# Patient Record
Sex: Female | Born: 1980 | Hispanic: Yes | Marital: Married | Smoking: Never smoker
Health system: Southern US, Community
[De-identification: ages and names within clinical notes are randomized; demographics above are authoritative.]

## PROBLEM LIST (undated history)

## (undated) DIAGNOSIS — I10 Essential (primary) hypertension: Secondary | ICD-10-CM

## (undated) DIAGNOSIS — E119 Type 2 diabetes mellitus without complications: Secondary | ICD-10-CM

## (undated) HISTORY — PX: HYSTEROSCOPY W/ ENDOMETRIAL ABLATION: SUR665

## (undated) HISTORY — PX: ABDOMINAL HYSTERECTOMY: SHX81

---

## 2011-02-25 ENCOUNTER — Emergency Department (INDEPENDENT_AMBULATORY_CARE_PROVIDER_SITE_OTHER): Payer: Medicaid Other

## 2011-02-25 ENCOUNTER — Emergency Department (HOSPITAL_BASED_OUTPATIENT_CLINIC_OR_DEPARTMENT_OTHER)
Admission: EM | Admit: 2011-02-25 | Discharge: 2011-02-25 | Disposition: A | Discharge status: Home / Self Care | Payer: Medicaid Other | Attending: Emergency Medicine | Admitting: Emergency Medicine

## 2011-02-25 DIAGNOSIS — W261XXA Contact with sword or dagger, initial encounter: Secondary | ICD-10-CM

## 2011-02-25 DIAGNOSIS — W260XXA Contact with knife, initial encounter: Secondary | ICD-10-CM | POA: Insufficient documentation

## 2011-02-25 DIAGNOSIS — M7989 Other specified soft tissue disorders: Secondary | ICD-10-CM

## 2011-02-25 DIAGNOSIS — S61409A Unspecified open wound of unspecified hand, initial encounter: Secondary | ICD-10-CM | POA: Insufficient documentation

## 2012-12-03 ENCOUNTER — Emergency Department (HOSPITAL_BASED_OUTPATIENT_CLINIC_OR_DEPARTMENT_OTHER): Payer: Medicaid Other

## 2012-12-03 ENCOUNTER — Emergency Department (HOSPITAL_BASED_OUTPATIENT_CLINIC_OR_DEPARTMENT_OTHER)
Admission: EM | Admit: 2012-12-03 | Discharge: 2012-12-03 | Disposition: A | Discharge status: Home / Self Care | Payer: Medicaid Other | Attending: Emergency Medicine | Admitting: Emergency Medicine

## 2012-12-03 ENCOUNTER — Encounter (HOSPITAL_BASED_OUTPATIENT_CLINIC_OR_DEPARTMENT_OTHER): Payer: Self-pay | Admitting: Family Medicine

## 2012-12-03 DIAGNOSIS — R509 Fever, unspecified: Secondary | ICD-10-CM | POA: Insufficient documentation

## 2012-12-03 DIAGNOSIS — R071 Chest pain on breathing: Secondary | ICD-10-CM | POA: Insufficient documentation

## 2012-12-03 DIAGNOSIS — B349 Viral infection, unspecified: Secondary | ICD-10-CM

## 2012-12-03 DIAGNOSIS — B9789 Other viral agents as the cause of diseases classified elsewhere: Secondary | ICD-10-CM | POA: Insufficient documentation

## 2012-12-03 DIAGNOSIS — R0602 Shortness of breath: Secondary | ICD-10-CM | POA: Insufficient documentation

## 2012-12-03 MED ORDER — GUAIFENESIN-CODEINE 100-10 MG/5ML PO SYRP: 5.0000 mL | ORAL_SOLUTION | Freq: Three times a day (TID) | ORAL | Status: DC | PRN

## 2012-12-03 MED ORDER — OSELTAMIVIR PHOSPHATE 75 MG PO CAPS: 75.0000 mg | ORAL_CAPSULE | Freq: Two times a day (BID) | ORAL | Status: DC

## 2012-12-03 NOTE — ED Provider Notes (Addendum)
History     CSN: [id number]  Arrival date & time 12/03/12  4098   First MD Initiated Contact with Patient 12/03/12 [phone]      Chief Complaint  Patient presents with  . Cough    (Consider location/radiation/quality/duration/timing/severity/associated sxs/prior treatment) HPI Comments: Patient presents with a two-day history of myalgias with runny nose cough and chest congestion. She's had some postnasal drip. She's had some fevers and chills. The fevers or subjective. She's had some nausea but no vomiting. She does feel a little bit short of breath. She has some pain on the left side of her chest with coughing and movement. She denies any leg pain or swelling. She's here with her daughter who has the same type symptoms.  Patient is a 31 y.o. female presenting with cough.  Cough Associated symptoms include chest pain, chills, rhinorrhea, myalgias and shortness of breath. Pertinent negatives include no headaches.    History reviewed. No pertinent past medical history.  Past Surgical History  Procedure Date  . Abdominal hysterectomy     No family history on file.  History  Substance Use Topics  . Smoking status: Never Smoker   . Smokeless tobacco: Not on file  . Alcohol Use: No    OB History    Grav Para Term Preterm Abortions TAB SAB Ect Mult Living                  Review of Systems  Constitutional: Positive for fever, chills and fatigue. Negative for diaphoresis.  HENT: Positive for congestion, rhinorrhea and postnasal drip. Negative for sneezing.   Eyes: Negative.   Respiratory: Positive for cough and shortness of breath. Negative for chest tightness.   Cardiovascular: Positive for chest pain. Negative for leg swelling.  Gastrointestinal: Positive for nausea. Negative for vomiting, abdominal pain, diarrhea and blood in stool.  Genitourinary: Negative for frequency, hematuria, flank pain and difficulty urinating.  Musculoskeletal: Positive for myalgias. Negative for  back pain and arthralgias.  Skin: Negative for rash.  Neurological: Negative for dizziness, speech difficulty, weakness, numbness and headaches.    Allergies  Review of patient's allergies indicates no known allergies.  Home Medications   Current Outpatient Rx  Name  Route  Sig  Dispense  Refill  . GUAIFENESIN-CODEINE 100-10 MG/5ML PO SYRP   Oral   Take 5 mLs by mouth 3 (three) times daily as needed for cough.   120 mL   0   . OSELTAMIVIR PHOSPHATE 75 MG PO CAPS   Oral   Take 1 capsule (75 mg total) by mouth every 12 (twelve) hours.   10 capsule   0     BP 113/94  Pulse 94  Temp 98 F (36.7 C) (Oral)  Resp 22  Ht 5' (1.524 m)  Wt 205 lb (92.987 kg)  BMI 40.04 kg/m2  SpO2 98%  Physical Exam  Constitutional: She is oriented to person, place, and time. She appears well-developed and well-nourished.  HENT:  Head: Normocephalic and atraumatic.  Right Ear: External ear normal.  Left Ear: External ear normal.  Mouth/Throat: Oropharynx is clear and moist.  Eyes: Pupils are equal, round, and reactive to light.  Neck: Normal range of motion. Neck supple.       No meningeal signs  Cardiovascular: Normal rate, regular rhythm and normal heart sounds.   Pulmonary/Chest: Effort normal and breath sounds normal. No respiratory distress. She has no wheezes. She has no rales. She exhibits tenderness (reproducible tenderness to the left mid chest wall).  Abdominal: Soft. Bowel sounds are normal. There is no tenderness. There is no rebound and no guarding.  Musculoskeletal: Normal range of motion. She exhibits no edema.  Lymphadenopathy:    She has no cervical adenopathy.  Neurological: She is alert and oriented to person, place, and time.  Skin: Skin is warm and dry. No rash noted.  Psychiatric: She has a normal mood and affect.    ED Course  Procedures (including critical care time)  No results found for this or any previous visit. Dg Chest 2 View  12/03/2012  *RADIOLOGY  REPORT*  Clinical Data: Cough, shortness of breath  CHEST - 2 VIEW  Comparison: None.  Findings: Cardiomediastinal silhouette is unremarkable.  No acute infiltrate or pleural effusion.  No pulmonary edema.  Central minimal bronchitic changes.  IMPRESSION: .  No acute infiltrate or pulmonary edema.  Central minimal bronchitic changes.   Original Report Authenticated By: Natasha Mead, M.D.     Date: 12/03/2012  Rate: 96  Rhythm: normal sinus rhythm  QRS Axis: normal  Intervals: normal  ST/T Wave abnormalities: nonspecific ST/T changes  Conduction Disutrbances:none  Narrative Interpretation:   Old EKG Reviewed: none available     1. Viral syndrome       MDM  Patient with flulike symptoms that started within the last 48 hours. She's well appearing here with normal oxygen saturations. Her last pulse was 86 so she did not have a persistent tachycardia. There is no evidence of pneumonia on a chest x-ray in her lungs are clear on exam. She has no other symptoms suggestive of pulmonary embolus. I will go ahead and start her on Tamiflu and Robitussin a.c. Advised to return if her symptoms worsen or are not improving in the next few days        Rolan Bucco, MD 12/03/12 1110  Rolan Bucco, MD 12/03/12 1147

## 2012-12-03 NOTE — ED Notes (Addendum)
Pt c/o cough and body aches x 4 days, pt also reports chest "tightness" since Monday. Pt took otc cough med without relief. Denies n/v/d and denies shob.

## 2016-10-09 ENCOUNTER — Emergency Department (HOSPITAL_BASED_OUTPATIENT_CLINIC_OR_DEPARTMENT_OTHER)
Admission: EM | Admit: 2016-10-09 | Discharge: 2016-10-09 | Disposition: A | Discharge status: Home / Self Care | Payer: Medicaid Other | Attending: Emergency Medicine | Admitting: Emergency Medicine

## 2016-10-09 ENCOUNTER — Emergency Department (HOSPITAL_BASED_OUTPATIENT_CLINIC_OR_DEPARTMENT_OTHER): Payer: Medicaid Other

## 2016-10-09 ENCOUNTER — Encounter (HOSPITAL_BASED_OUTPATIENT_CLINIC_OR_DEPARTMENT_OTHER): Payer: Self-pay | Admitting: *Deleted

## 2016-10-09 DIAGNOSIS — Y939 Activity, unspecified: Secondary | ICD-10-CM | POA: Insufficient documentation

## 2016-10-09 DIAGNOSIS — S0081XA Abrasion of other part of head, initial encounter: Secondary | ICD-10-CM | POA: Diagnosis not present

## 2016-10-09 DIAGNOSIS — S29011A Strain of muscle and tendon of front wall of thorax, initial encounter: Secondary | ICD-10-CM

## 2016-10-09 DIAGNOSIS — S46812A Strain of other muscles, fascia and tendons at shoulder and upper arm level, left arm, initial encounter: Secondary | ICD-10-CM | POA: Insufficient documentation

## 2016-10-09 DIAGNOSIS — S46912A Strain of unspecified muscle, fascia and tendon at shoulder and upper arm level, left arm, initial encounter: Secondary | ICD-10-CM

## 2016-10-09 DIAGNOSIS — Y929 Unspecified place or not applicable: Secondary | ICD-10-CM | POA: Diagnosis not present

## 2016-10-09 DIAGNOSIS — I10 Essential (primary) hypertension: Secondary | ICD-10-CM | POA: Insufficient documentation

## 2016-10-09 DIAGNOSIS — Y999 Unspecified external cause status: Secondary | ICD-10-CM | POA: Insufficient documentation

## 2016-10-09 DIAGNOSIS — S4992XA Unspecified injury of left shoulder and upper arm, initial encounter: Secondary | ICD-10-CM | POA: Diagnosis present

## 2016-10-09 DIAGNOSIS — S29012A Strain of muscle and tendon of back wall of thorax, initial encounter: Secondary | ICD-10-CM | POA: Insufficient documentation

## 2016-10-09 DIAGNOSIS — Z79899 Other long term (current) drug therapy: Secondary | ICD-10-CM | POA: Diagnosis not present

## 2016-10-09 HISTORY — DX: Essential (primary) hypertension: I10

## 2016-10-09 MED ORDER — IBUPROFEN 800 MG PO TABS
800.0000 mg | ORAL_TABLET | Freq: Once | ORAL | Status: AC
  Administered 2016-10-09: 800 mg via ORAL
  Filled 2016-10-09: qty 1
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
## 2016-10-09 NOTE — ED Notes (Signed)
Pt back from x-ray.

## 2017-09-13 ENCOUNTER — Encounter (HOSPITAL_BASED_OUTPATIENT_CLINIC_OR_DEPARTMENT_OTHER): Payer: Self-pay | Admitting: Adult Health

## 2017-09-13 ENCOUNTER — Emergency Department (HOSPITAL_BASED_OUTPATIENT_CLINIC_OR_DEPARTMENT_OTHER)
Admission: EM | Admit: 2017-09-13 | Discharge: 2017-09-13 | Disposition: A | Discharge status: Home / Self Care | Payer: Medicaid Other | Attending: Emergency Medicine | Admitting: Emergency Medicine

## 2017-09-13 DIAGNOSIS — S3992XA Unspecified injury of lower back, initial encounter: Secondary | ICD-10-CM | POA: Diagnosis present

## 2017-09-13 DIAGNOSIS — Y999 Unspecified external cause status: Secondary | ICD-10-CM | POA: Diagnosis not present

## 2017-09-13 DIAGNOSIS — E119 Type 2 diabetes mellitus without complications: Secondary | ICD-10-CM | POA: Diagnosis not present

## 2017-09-13 DIAGNOSIS — Y92000 Kitchen of unspecified non-institutional (private) residence as  the place of occurrence of the external cause: Secondary | ICD-10-CM | POA: Insufficient documentation

## 2017-09-13 DIAGNOSIS — I10 Essential (primary) hypertension: Secondary | ICD-10-CM | POA: Insufficient documentation

## 2017-09-13 DIAGNOSIS — X500XXA Overexertion from strenuous movement or load, initial encounter: Secondary | ICD-10-CM | POA: Insufficient documentation

## 2017-09-13 DIAGNOSIS — S39012A Strain of muscle, fascia and tendon of lower back, initial encounter: Secondary | ICD-10-CM

## 2017-09-13 DIAGNOSIS — Y9389 Activity, other specified: Secondary | ICD-10-CM | POA: Diagnosis not present

## 2017-09-13 DIAGNOSIS — Z79899 Other long term (current) drug therapy: Secondary | ICD-10-CM | POA: Insufficient documentation

## 2017-09-13 HISTORY — DX: Type 2 diabetes mellitus without complications: E11.9

## 2017-09-13 MED ORDER — DICLOFENAC SODIUM 75 MG PO TBEC: 75.0000 mg | DELAYED_RELEASE_TABLET | Freq: Two times a day (BID) | ORAL | 0 refills | Status: DC

## 2017-09-13 MED ORDER — METHOCARBAMOL 500 MG PO TABS: 500.0000 mg | ORAL_TABLET | Freq: Two times a day (BID) | ORAL | 0 refills | Status: DC

## 2017-09-13 MED ORDER — HYDROMORPHONE HCL 1 MG/ML IJ SOLN
1.0000 mg | Freq: Once | INTRAMUSCULAR | Status: AC
  Administered 2017-09-13: 1 mg via INTRAMUSCULAR
  Filled 2017-09-13: qty 1

## 2017-09-13 MED ORDER — KETOROLAC TROMETHAMINE 60 MG/2ML IM SOLN
60.0000 mg | Freq: Once | INTRAMUSCULAR | Status: AC
  Administered 2017-09-13: 60 mg via INTRAMUSCULAR
  Filled 2017-09-13: qty 2

## 2017-09-13 NOTE — Discharge Instructions (Signed)
See your Physician for recheck in 3-4 days °

## 2017-09-13 NOTE — ED Provider Notes (Signed)
[facility]   CSN: [id number] Arrival date & time: 09/13/17  2106     History   Chief Complaint Chief Complaint  Patient presents with  . Back Pain    HPI Shannon Hicks is a 36 y.o. female.  The history is provided by the patient. No language interpreter was used.  Back Pain   This is a new problem. The current episode started 2 days ago. The problem occurs constantly. The problem has been gradually worsening. The pain is associated with no known injury. The pain is present in the lumbar spine. The quality of the pain is described as shooting. The pain radiates to the right thigh. The pain is moderate. The pain is worse during the day. Associated symptoms include leg pain. She has tried NSAIDs for the symptoms. The treatment provided no relief. Risk factors include obesity.  Pt reports she was lifting in kitchen 2 days ago and began having back pain.  Pt reports she has had the same in the past.    Past Medical History:  Diagnosis Date  . Diabetes mellitus without complication (HCC)   . Hypertension     There are no active problems to display for this patient.   Past Surgical History:  Procedure Laterality Date  . ABDOMINAL HYSTERECTOMY      OB History    No data available       Home Medications    Prior to Admission medications   Medication Sig Start Date End Date Taking? Authorizing Provider  Canagliflozin (INVOKANA PO) Take by mouth.    [provider]    Family History History reviewed. No pertinent family history.  Social History Social History  Substance Use Topics  . Smoking status: Never Smoker  . Smokeless tobacco: Never Used  . Alcohol use No     Allergies   Patient has no known allergies.   Review of Systems Review of Systems  Musculoskeletal: Positive for back pain.  All other systems reviewed and are negative.    Physical Exam Updated Vital Signs BP (!) 137/103   Pulse 96   Temp 98.7 F (37.1  C) (Oral)   Resp 18   Ht 5' (1.524 m)   Wt 86.2 kg (190 lb)   LMP  (LMP Unknown)   SpO2 94%   BMI 37.11 kg/m   Physical Exam  Constitutional: She is oriented to person, place, and time. She appears well-developed and well-nourished.  HENT:  Head: Normocephalic.  Eyes: Pupils are equal, round, and reactive to light. EOM are normal.  Neck: Normal range of motion.  Cardiovascular: Normal rate and regular rhythm.   Pulmonary/Chest: Effort normal.  Abdominal: Soft. Bowel sounds are normal. She exhibits no distension.  Musculoskeletal: Normal range of motion.  Tender low back,  Tender right buttock, sciatic area,  Decreased range of motion second to pain,  nv and ns intact   Neurological: She is alert and oriented to person, place, and time.  Psychiatric: She has a normal mood and affect.  Nursing note and vitals reviewed.    ED Treatments / Results  Labs (all labs ordered are listed, but only abnormal results are displayed) Labs Reviewed - No data to display  EKG  EKG Interpretation None       Radiology No results found.  Procedures Procedures (including critical care time)  Medications Ordered in ED Medications  ketorolac (TORADOL) injection 60 mg (not administered)  HYDROmorphone (DILAUDID) injection 1 mg (not administered)  Initial Impression / Assessment and Plan / ED Course  I have reviewed the triage vital signs and the nursing notes.  Pertinent labs & imaging results that were available during my care of the patient were reviewed by me and considered in my medical decision making (see chart for details).     Pt given injection of torodol and dilaudid.  Final Clinical Impressions(s) / ED Diagnoses   Final diagnoses:  Strain of lumbar region, initial encounter    New Prescriptions New Prescriptions   DICLOFENAC (VOLTAREN) 75 MG EC TABLET    Take 1 tablet (75 mg total) by mouth 2 (two) times daily.   METHOCARBAMOL (ROBAXIN) 500 MG TABLET    Take  1 tablet (500 mg total) by mouth 2 (two) times daily.     Osie Cheeks 09/13/17 2309    Rolland Porter, MD 09/13/17 [phone]

## 2017-09-13 NOTE — ED Triage Notes (Signed)
PResents with worsening back pain that began a few days ago after picking something up in the kitchen. THe pain raidates down right leg. denies numbness and tingling. Pain is worse with movement. Took ALeve at 1330 this afternoon with no relief.

## 2017-09-13 NOTE — ED Notes (Signed)
ED Provider at bedside. 

## 2017-09-20 ENCOUNTER — Emergency Department (HOSPITAL_BASED_OUTPATIENT_CLINIC_OR_DEPARTMENT_OTHER): Payer: Medicaid Other

## 2017-09-20 ENCOUNTER — Emergency Department (HOSPITAL_BASED_OUTPATIENT_CLINIC_OR_DEPARTMENT_OTHER)
Admission: EM | Admit: 2017-09-20 | Discharge: 2017-09-20 | Disposition: A | Discharge status: Home / Self Care | Payer: Medicaid Other | Attending: Emergency Medicine | Admitting: Emergency Medicine

## 2017-09-20 ENCOUNTER — Encounter (HOSPITAL_BASED_OUTPATIENT_CLINIC_OR_DEPARTMENT_OTHER): Payer: Self-pay | Admitting: *Deleted

## 2017-09-20 DIAGNOSIS — R112 Nausea with vomiting, unspecified: Secondary | ICD-10-CM | POA: Insufficient documentation

## 2017-09-20 DIAGNOSIS — E1143 Type 2 diabetes mellitus with diabetic autonomic (poly)neuropathy: Secondary | ICD-10-CM | POA: Diagnosis not present

## 2017-09-20 DIAGNOSIS — Z7984 Long term (current) use of oral hypoglycemic drugs: Secondary | ICD-10-CM | POA: Diagnosis not present

## 2017-09-20 DIAGNOSIS — I10 Essential (primary) hypertension: Secondary | ICD-10-CM | POA: Diagnosis not present

## 2017-09-20 DIAGNOSIS — R1012 Left upper quadrant pain: Secondary | ICD-10-CM | POA: Diagnosis present

## 2017-09-20 DIAGNOSIS — E119 Type 2 diabetes mellitus without complications: Secondary | ICD-10-CM | POA: Insufficient documentation

## 2017-09-20 DIAGNOSIS — K3184 Gastroparesis: Secondary | ICD-10-CM | POA: Diagnosis not present

## 2017-09-20 LAB — URINALYSIS, MICROSCOPIC (REFLEX)
BACTERIA UA: NONE SEEN
RBC / HPF: NONE SEEN RBC/hpf (ref 0–5)

## 2017-09-20 LAB — URINALYSIS, ROUTINE W REFLEX MICROSCOPIC
BILIRUBIN URINE: NEGATIVE
Hgb urine dipstick: NEGATIVE
KETONES UR: 15 mg/dL — AB
Leukocytes, UA: NEGATIVE
NITRITE: NEGATIVE
PH: 5.5 (ref 5.0–8.0)
Protein, ur: NEGATIVE mg/dL
SPECIFIC GRAVITY, URINE: 1.015 (ref 1.005–1.030)

## 2017-09-20 LAB — PREGNANCY, URINE: Preg Test, Ur: NEGATIVE

## 2017-09-20 LAB — COMPREHENSIVE METABOLIC PANEL
ALBUMIN: 3.9 g/dL (ref 3.5–5.0)
ALT: 15 U/L (ref 14–54)
ANION GAP: 7 (ref 5–15)
AST: 17 U/L (ref 15–41)
Alkaline Phosphatase: 85 U/L (ref 38–126)
BUN: 12 mg/dL (ref 6–20)
CHLORIDE: 98 mmol/L — AB (ref 101–111)
CO2: 26 mmol/L (ref 22–32)
Calcium: 9 mg/dL (ref 8.9–10.3)
Creatinine, Ser: 0.7 mg/dL (ref 0.44–1.00)
GFR calc Af Amer: >60 mL/min (ref >60)
GFR calc non Af Amer: >60 mL/min (ref >60)
GLUCOSE: 426 mg/dL — AB (ref 65–99)
POTASSIUM: 3.8 mmol/L (ref 3.5–5.1)
SODIUM: 131 mmol/L — AB (ref 135–145)
TOTAL PROTEIN: 7.2 g/dL (ref 6.5–8.1)
Total Bilirubin: 0.2 mg/dL — ABNORMAL LOW (ref 0.3–1.2)

## 2017-09-20 LAB — CBC WITH DIFFERENTIAL/PLATELET
BASOS ABS: 0.1 10*3/uL (ref 0.0–0.1)
BASOS PCT: 0 %
EOS ABS: 0.3 10*3/uL (ref 0.0–0.7)
Eosinophils Relative: 2 %
HCT: 37.6 % (ref 36.0–46.0)
Hemoglobin: 13.4 g/dL (ref 12.0–15.0)
Lymphocytes Relative: 26 %
Lymphs Abs: 3 10*3/uL (ref 0.7–4.0)
MCH: 29.6 pg (ref 26.0–34.0)
MCHC: 35.6 g/dL (ref 30.0–36.0)
MCV: 83 fL (ref 78.0–100.0)
MONO ABS: 0.6 10*3/uL (ref 0.1–1.0)
MONOS PCT: 5 %
Neutro Abs: 7.6 10*3/uL (ref 1.7–7.7)
Neutrophils Relative %: 67 %
PLATELETS: 293 10*3/uL (ref 150–400)
RBC: 4.53 MIL/uL (ref 3.87–5.11)
RDW: 13 % (ref 11.5–15.5)
WBC: 11.5 10*3/uL — AB (ref 4.0–10.5)

## 2017-09-20 LAB — RAPID URINE DRUG SCREEN, HOSP PERFORMED
Amphetamines: NOT DETECTED
BARBITURATES: NOT DETECTED
BENZODIAZEPINES: NOT DETECTED
COCAINE: NOT DETECTED
Opiates: NOT DETECTED
Tetrahydrocannabinol: NOT DETECTED

## 2017-09-20 LAB — CBG MONITORING, ED: GLUCOSE-CAPILLARY: 360 mg/dL — AB (ref 65–99)

## 2017-09-20 LAB — LIPASE, BLOOD: LIPASE: 35 U/L (ref 11–51)

## 2017-09-20 LAB — TROPONIN I

## 2017-09-20 MED ORDER — ONDANSETRON HCL 4 MG/2ML IJ SOLN
4.0000 mg | Freq: Once | INTRAMUSCULAR | Status: AC
  Administered 2017-09-20: 4 mg via INTRAVENOUS
  Filled 2017-09-20: qty 2

## 2017-09-20 MED ORDER — FAMOTIDINE 20 MG PO TABS: 20.0000 mg | ORAL_TABLET | Freq: Two times a day (BID) | ORAL | 0 refills | Status: DC

## 2017-09-20 MED ORDER — INSULIN REGULAR HUMAN 100 UNIT/ML IJ SOLN
4.0000 [IU] | Freq: Once | INTRAMUSCULAR | Status: AC
  Administered 2017-09-20: 4 [IU] via SUBCUTANEOUS
  Filled 2017-09-20: qty 1

## 2017-09-20 MED ORDER — DICYCLOMINE HCL 10 MG/ML IM SOLN
20.0000 mg | Freq: Once | INTRAMUSCULAR | Status: AC
  Administered 2017-09-20: 20 mg via INTRAMUSCULAR
  Filled 2017-09-20: qty 2

## 2017-09-20 MED ORDER — GI COCKTAIL ~~LOC~~
30.0000 mL | Freq: Once | ORAL | Status: AC
  Administered 2017-09-20: 30 mL via ORAL
  Filled 2017-09-20: qty 30

## 2017-09-20 MED ORDER — METOCLOPRAMIDE HCL 10 MG PO TABS: 10.0000 mg | ORAL_TABLET | Freq: Three times a day (TID) | ORAL | 0 refills | Status: DC

## 2017-09-20 MED ORDER — KETOROLAC TROMETHAMINE 30 MG/ML IJ SOLN
15.0000 mg | Freq: Once | INTRAMUSCULAR | Status: AC
  Administered 2017-09-20: 15 mg via INTRAVENOUS
  Filled 2017-09-20: qty 1

## 2017-09-20 MED ORDER — INSULIN REGULAR HUMAN 100 UNIT/ML IJ SOLN
2.0000 [IU] | Freq: Once | INTRAMUSCULAR | Status: AC
  Administered 2017-09-20: 2 [IU] via SUBCUTANEOUS
  Filled 2017-09-20: qty 1

## 2017-09-20 NOTE — ED Provider Notes (Signed)
[facility]   CSN: [id number] Arrival date & time: 09/20/17  0045     History   Chief Complaint Chief Complaint  Patient presents with  . Abdominal Pain    HPI Shannon Hicks is a 36 y.o. female.  The history is provided by the patient.  Abdominal Pain   This is a new problem. The current episode started 3 to 5 hours ago. The problem occurs constantly. The problem has not changed since onset.The pain is associated with eating. The pain is located in the LUQ (and flank). The pain is moderate. Associated symptoms include nausea and vomiting. Pertinent negatives include fever, diarrhea and flatus. Associated symptoms comments: Having a stool and not taking her DM meds. Nothing aggravates the symptoms. Nothing relieves the symptoms. Past workup does not include GI consult. Her past medical history does not include PUD.    Past Medical History:  Diagnosis Date  . Diabetes mellitus without complication (HCC)   . Hypertension     There are no active problems to display for this patient.   Past Surgical History:  Procedure Laterality Date  . ABDOMINAL HYSTERECTOMY      OB History    No data available       Home Medications    Prior to Admission medications   Medication Sig Start Date End Date Taking? Authorizing Provider  Canagliflozin (INVOKANA PO) Take by mouth.    [provider]  diclofenac (VOLTAREN) 75 MG EC tablet Take 1 tablet (75 mg total) by mouth 2 (two) times daily. 09/13/17   Elson Areas, PA-C  methocarbamol (ROBAXIN) 500 MG tablet Take 1 tablet (500 mg total) by mouth 2 (two) times daily. 09/13/17   Elson Areas, PA-C    Family History History reviewed. No pertinent family history.  Social History Social History  Substance Use Topics  . Smoking status: Never Smoker  . Smokeless tobacco: Never Used  . Alcohol use No     Allergies   Patient has no known allergies.   Review of Systems Review of Systems    Constitutional: Negative for fever.  Gastrointestinal: Positive for abdominal pain, nausea and vomiting. Negative for diarrhea and flatus.  All other systems reviewed and are negative.    Physical Exam Updated Vital Signs BP 124/66 (BP Location: Left Arm)   Pulse 93   Temp 98.2 F (36.8 C) (Oral)   Resp (!) 28   Ht 5' (1.524 m)   Wt 86.2 kg (190 lb)   LMP  (LMP Unknown)   SpO2 97%   BMI 37.11 kg/m   Physical Exam  Constitutional: She is oriented to person, place, and time. She appears well-developed and well-nourished. No distress.  HENT:  Head: Normocephalic and atraumatic.  Nose: Nose normal.  Mouth/Throat: No oropharyngeal exudate.  Eyes: Pupils are equal, round, and reactive to light. Conjunctivae are normal.  Neck: Normal range of motion. Neck supple.  Cardiovascular: Normal rate, regular rhythm, normal heart sounds and intact distal pulses.   Pulmonary/Chest: Effort normal and breath sounds normal. She has no wheezes. She has no rales.  Abdominal: Soft. Bowel sounds are normal. She exhibits no mass. There is no tenderness. There is no rebound and no guarding.  Musculoskeletal: Normal range of motion.  Neurological: She is alert and oriented to person, place, and time.  Skin: Skin is warm and dry. Capillary refill takes less than 2 seconds.  Psychiatric: She has a normal mood and affect.  ED Treatments / Results  Labs (all labs ordered are listed, but only abnormal results are displayed) Labs Reviewed  CBC WITH DIFFERENTIAL/PLATELET - Abnormal; Notable for the following:       Result Value   WBC 11.5 (*)    All other components within normal limits  COMPREHENSIVE METABOLIC PANEL - Abnormal; Notable for the following:    Sodium 131 (*)    Chloride 98 (*)    Glucose, Bld 426 (*)    Total Bilirubin 0.2 (*)    All other components within normal limits  URINALYSIS, ROUTINE W REFLEX MICROSCOPIC - Abnormal; Notable for the following:    Glucose, UA >=500 (*)     Ketones, ur 15 (*)    All other components within normal limits  URINALYSIS, MICROSCOPIC (REFLEX) - Abnormal; Notable for the following:    Squamous Epithelial / LPF 0-5 (*)    All other components within normal limits  PREGNANCY, URINE  LIPASE, BLOOD  TROPONIN I  RAPID URINE DRUG SCREEN, HOSP PERFORMED    EKG  EKG Interpretation  Date/Time:  Friday September 20 2017 01:07:12 EDT Ventricular Rate:  92 PR Interval:    QRS Duration: 85 QT Interval:  384 QTC Calculation: 475 R Axis:   48 Text Interpretation:  Sinus rhythm Confirmed by Elmond Poehlman ([id number]) on 09/20/2017 1:18:39 AM       Radiology Ct Renal Stone Study  Result Date: 09/20/2017 CLINICAL DATA:  Left flank pain.  Left upper quadrant pain. EXAM: CT ABDOMEN AND PELVIS WITHOUT CONTRAST TECHNIQUE: Multidetector CT imaging of the abdomen and pelvis was performed following the standard protocol without IV contrast. COMPARISON:  CT 09/04/2015 FINDINGS: Lower chest: Scarring in the right middle lobe. No consolidation. No pleural fluid. Hepatobiliary: The liver is enlarged spanning 22.5 cm. Severe hepatic steatosis. Mild focal fatty sparing adjacent gallbladder fossa. No discrete focal lesion. Gallbladder is nondistended, no calcified stone. Pancreas: No ductal dilatation or inflammation. Spleen: Normal in size without focal abnormality. Adrenals/Urinary Tract: Normal adrenal glands. Symmetric in size without stones or hydronephrosis. There is no perinephric stranding. Both ureters are decompressed without stones along the course. Urinary bladder is physiologically distended. No bladder wall thickening. Stomach/Bowel: Stomach is distended with ingested contents. Appendix appears normal. No evidence of bowel wall thickening, distention, or inflammatory changes. No significant diverticular disease. Vascular/Lymphatic: Normal caliber abdominal aorta. Small retroperitoneal nodes are not enlarged by size criteria. Reproductive: Cystic  changes in the left ovary which were seen previously. Unremarkable CT appearance of the uterus. Right ovary is normal in size. Other: No free air, free fluid, or intra-abdominal fluid collection. Musculoskeletal: There are no acute or suspicious osseous abnormalities. IMPRESSION: 1.  No renal stones or obstructive uropathy.  No acute abnormality. 2. Hepatomegaly and severe steatosis. 3. Stable cystic change in the left ovary which has been present on CT's over the last 5 years. Electronically Signed   By: Rubye Oaks M.D.   On: 09/20/2017 02:30    Procedures Procedures (including critical care time)  Medications Ordered in ED Medications  ondansetron (ZOFRAN) injection 4 mg (4 mg Intravenous Given 09/20/17 0145)  gi cocktail (Maalox,Lidocaine,Donnatal) (30 mLs Oral Given 09/20/17 0148)  ketorolac (TORADOL) 30 MG/ML injection 15 mg (15 mg Intravenous Given 09/20/17 0214)  dicyclomine (BENTYL) injection 20 mg (20 mg Intramuscular Given 09/20/17 0242)       Final Clinical Impressions(s) / ED Diagnoses  This is likely gastroparesis since sugars are not well controlled and patient is not taking her medication  and stomach is full with food on CT scan.  She has been advised to restart medication in the am, eat a carbohydrate free diet and follow up this week with her diabetes specialist.    Strict return precautions given for  Shortness of breath, swelling or the lips or tongue, chest pain, dyspnea on exertion, new weakness or numbness changes in vision or speech,  Inability to tolerate liquids or food, changes in voice cough, altered mental status or any concerns. No signs of systemic illness or infection. The patient is nontoxic-appearing on exam and vital signs are within normal limits.    I have reviewed the triage vital signs and the nursing notes. Pertinent labs &imaging results that were available during my care of the patient were reviewed by me and considered in my medical decision making  (see chart for details).  After history, exam, and medical workup I feel the patient has been appropriately medically screened and is safe for discharge home. Pertinent diagnoses were discussed with the patient. Patient was given return precautions.      New Prescriptions New Prescriptions   No medications on file     Dio Giller, MD 09/20/17 9147

## 2017-09-20 NOTE — ED Triage Notes (Signed)
Pt with LUQ pain x 2 hours 3 episodes of vomiting denies fevers or Diarrhea

## 2017-09-20 NOTE — ED Notes (Signed)
ED Provider at bedside. 

## 2021-01-17 ENCOUNTER — Encounter (HOSPITAL_BASED_OUTPATIENT_CLINIC_OR_DEPARTMENT_OTHER): Payer: Self-pay | Admitting: Emergency Medicine

## 2021-01-17 ENCOUNTER — Other Ambulatory Visit: Payer: Self-pay

## 2021-01-17 DIAGNOSIS — E119 Type 2 diabetes mellitus without complications: Secondary | ICD-10-CM | POA: Diagnosis not present

## 2021-01-17 DIAGNOSIS — I10 Essential (primary) hypertension: Secondary | ICD-10-CM | POA: Diagnosis not present

## 2021-01-17 DIAGNOSIS — Z7984 Long term (current) use of oral hypoglycemic drugs: Secondary | ICD-10-CM | POA: Diagnosis not present

## 2021-01-17 DIAGNOSIS — R1013 Epigastric pain: Secondary | ICD-10-CM | POA: Diagnosis not present

## 2021-01-17 DIAGNOSIS — R112 Nausea with vomiting, unspecified: Secondary | ICD-10-CM | POA: Insufficient documentation

## 2021-01-17 LAB — URINALYSIS, ROUTINE W REFLEX MICROSCOPIC
Bilirubin Urine: NEGATIVE
Glucose, UA: >=500 mg/dL — AB
Hgb urine dipstick: NEGATIVE
Ketones, ur: 80 mg/dL — AB
Leukocytes,Ua: NEGATIVE
Nitrite: NEGATIVE
Protein, ur: NEGATIVE mg/dL
Specific Gravity, Urine: 1.01 (ref 1.005–1.030)
pH: 6.5 (ref 5.0–8.0)

## 2021-01-17 LAB — URINALYSIS, MICROSCOPIC (REFLEX)

## 2021-01-17 LAB — PREGNANCY, URINE: Preg Test, Ur: NEGATIVE

## 2021-01-17 MED ORDER — FENTANYL CITRATE (PF) 100 MCG/2ML IJ SOLN
50.0000 ug | INTRAMUSCULAR | Status: AC | PRN
  Administered 2021-01-17 – 2021-01-18 (×2): 50 ug via INTRAVENOUS
  Filled 2021-01-17 (×2): qty 2

## 2021-01-17 MED ORDER — ONDANSETRON HCL 4 MG/2ML IJ SOLN
4.0000 mg | Freq: Once | INTRAMUSCULAR | Status: AC
  Administered 2021-01-17: 4 mg via INTRAVENOUS
  Filled 2021-01-17: qty 2

## 2021-01-17 NOTE — ED Notes (Signed)
Unable to draw labs from I.V.

## 2021-01-17 NOTE — ED Triage Notes (Signed)
Pt reports bilateral upper abd pain, N/V for 4 hours; pt tachycardic in triage; pt vaccinated for COVID

## 2021-01-17 NOTE — ED Notes (Signed)
Attempted PIV twice, unsuccessfully; pt tolerated well; pt educated on not leaving without alerting front desk

## 2021-01-18 ENCOUNTER — Emergency Department (HOSPITAL_BASED_OUTPATIENT_CLINIC_OR_DEPARTMENT_OTHER)
Admission: EM | Admit: 2021-01-18 | Discharge: 2021-01-18 | Disposition: A | Discharge status: Home / Self Care | Payer: 59 | Attending: Emergency Medicine | Admitting: Emergency Medicine

## 2021-01-18 ENCOUNTER — Emergency Department (HOSPITAL_BASED_OUTPATIENT_CLINIC_OR_DEPARTMENT_OTHER): Payer: 59

## 2021-01-18 DIAGNOSIS — R1013 Epigastric pain: Secondary | ICD-10-CM

## 2021-01-18 LAB — COMPREHENSIVE METABOLIC PANEL
ALT: 21 U/L (ref 0–44)
AST: 20 U/L (ref 15–41)
Albumin: 4.3 g/dL (ref 3.5–5.0)
Alkaline Phosphatase: 55 U/L (ref 38–126)
Anion gap: 11 (ref 5–15)
BUN: 15 mg/dL (ref 6–20)
CO2: 20 mmol/L — ABNORMAL LOW (ref 22–32)
Calcium: 9 mg/dL (ref 8.9–10.3)
Chloride: 103 mmol/L (ref 98–111)
Creatinine, Ser: 0.63 mg/dL (ref 0.44–1.00)
GFR, Estimated: >60 mL/min (ref >60)
Glucose, Bld: 232 mg/dL — ABNORMAL HIGH (ref 70–99)
Potassium: 3.8 mmol/L (ref 3.5–5.1)
Sodium: 134 mmol/L — ABNORMAL LOW (ref 135–145)
Total Bilirubin: 0.8 mg/dL (ref 0.3–1.2)
Total Protein: 7.8 g/dL (ref 6.5–8.1)

## 2021-01-18 LAB — CBC
HCT: 40.1 % (ref 36.0–46.0)
Hemoglobin: 14.6 g/dL (ref 12.0–15.0)
MCH: 30.7 pg (ref 26.0–34.0)
MCHC: 36.4 g/dL — ABNORMAL HIGH (ref 30.0–36.0)
MCV: 84.4 fL (ref 80.0–100.0)
Platelets: 369 10*3/uL (ref 150–400)
RBC: 4.75 MIL/uL (ref 3.87–5.11)
RDW: 12.9 % (ref 11.5–15.5)
WBC: 13.3 10*3/uL — ABNORMAL HIGH (ref 4.0–10.5)
nRBC: 0 % (ref 0.0–0.2)

## 2021-01-18 LAB — LIPASE, BLOOD: Lipase: 30 U/L (ref 11–51)

## 2021-01-18 MED ORDER — IOHEXOL 300 MG/ML  SOLN
100.0000 mL | Freq: Once | INTRAMUSCULAR | Status: AC | PRN
  Administered 2021-01-18: 100 mL via INTRAVENOUS

## 2021-01-18 MED ORDER — LACTATED RINGERS IV BOLUS
1000.0000 mL | Freq: Once | INTRAVENOUS | Status: AC
  Administered 2021-01-18: 1000 mL via INTRAVENOUS

## 2021-01-18 MED ORDER — SUCRALFATE 1 G PO TABS: 1.0000 g | ORAL_TABLET | Freq: Three times a day (TID) | ORAL | 1 refills | Status: AC

## 2021-01-18 MED ORDER — ONDANSETRON HCL 4 MG/2ML IJ SOLN
4.0000 mg | Freq: Once | INTRAMUSCULAR | Status: AC
  Administered 2021-01-18: 4 mg via INTRAVENOUS
  Filled 2021-01-18: qty 2

## 2021-01-18 MED ORDER — SUCRALFATE 1 GM/10ML PO SUSP
1.0000 g | Freq: Once | ORAL | Status: AC
  Administered 2021-01-18: 1 g via ORAL
  Filled 2021-01-18: qty 10

## 2021-01-18 MED ORDER — PANTOPRAZOLE SODIUM 40 MG IV SOLR
40.0000 mg | Freq: Once | INTRAVENOUS | Status: AC
  Administered 2021-01-18: 40 mg via INTRAVENOUS
  Filled 2021-01-18: qty 40

## 2021-01-18 MED ORDER — OMEPRAZOLE 40 MG PO CPDR: DELAYED_RELEASE_CAPSULE | ORAL | 0 refills | Status: DC

## 2021-01-18 MED ORDER — FENTANYL CITRATE (PF) 100 MCG/2ML IJ SOLN
50.0000 ug | Freq: Once | INTRAMUSCULAR | Status: AC
  Administered 2021-01-18: 50 ug via INTRAVENOUS
  Filled 2021-01-18: qty 2

## 2021-01-18 NOTE — ED Provider Notes (Signed)
[facility] Provider Note: Shannon Dell, MD, FACEP  CSN: [id number] MRN: [id number] ARRIVAL: 01/17/21 at 2123 ROOM: [facility]04/[facility]04   CHIEF COMPLAINT  Abdominal Pain   HISTORY OF PRESENT ILLNESS  01/18/21 3:18 AM Shannon Hicks is a 40 y.o. female who developed epigastric pain about 4 hours prior to arrival. It has subsequently radiated to the left upper quadrant. She initially rated it as an 8 out of 10 but currently rates it as a 6 out of 10. It is worse with movement or palpation. It has a cramping component but also, when it gets severe, a sensation that she has been punched in the stomach. She has had associated nausea and vomiting but no diarrhea, fever or urinary symptoms.   Past Medical History:  Diagnosis Date  . Diabetes mellitus without complication (HCC)   . Hypertension     Past Surgical History:  Procedure Laterality Date  . ABDOMINAL HYSTERECTOMY    . HYSTEROSCOPY W/ ENDOMETRIAL ABLATION      No family history on file.  Social History   Tobacco Use  . Smoking status: Never Smoker  . Smokeless tobacco: Never Used  Vaping Use  . Vaping Use: Never used  Substance Use Topics  . Alcohol use: No  . Drug use: No    Prior to Admission medications   Medication Sig Start Date End Date Taking? Authorizing Provider  omeprazole (PRILOSEC) 40 MG capsule Take 1 tablet every morning at least 30 minutes before first dose of Carafate. 01/18/21  Yes Lalla Laham, MD  sucralfate (CARAFATE) 1 g tablet Take 1 tablet (1 g total) by mouth 4 (four) times daily -  with meals and at bedtime. 01/18/21  Yes Morgin Halls, MD  Canagliflozin (INVOKANA PO) Take by mouth.    [provider]  famotidine (PEPCID) 20 MG tablet Take 1 tablet (20 mg total) by mouth 2 (two) times daily. 09/20/17 01/18/21  Palumbo, April, MD  metoCLOPramide (REGLAN) 10 MG tablet Take 1 tablet (10 mg total) by mouth 3 (three) times daily before meals. 09/20/17 01/18/21  Palumbo, April, MD     Allergies Patient has no known allergies.   REVIEW OF SYSTEMS  Negative except as noted here or in the History of Present Illness.   PHYSICAL EXAMINATION  Initial Vital Signs Blood pressure 104/67, pulse (!) 111, temperature 99.1 F (37.3 C), temperature source Oral, resp. rate 20, height 5' (1.524 m), weight 78 kg, SpO2 100 %.  Examination General: Well-developed, well-nourished female in no acute distress; appearance consistent with age of record HENT: normocephalic; atraumatic Eyes: pupils equal, round and reactive to light; extraocular muscles intact Neck: supple Heart: regular rate and rhythm; tachycardia Lungs: clear to auscultation bilaterally Abdomen: soft; nondistended; epigastric and left upper quadrant tenderness; no masses or hepatosplenomegaly; bowel sounds present Extremities: No deformity; full range of motion; pulses normal Neurologic: Awake, alert and oriented; motor function intact in all extremities and symmetric; no facial droop Skin: Warm and dry Psychiatric: Normal mood and affect   RESULTS   Summary of this visit's results, reviewed and interpreted by myself:   EKG Interpretation  Date/Time:    Ventricular Rate:    PR Interval:    QRS Duration:   QT Interval:    QTC Calculation:   R Axis:     Text Interpretation:        Laboratory Studies: Results for orders placed or performed during the hospital encounter of 01/18/21 (from the past 24 hour(s))  Urinalysis, Routine w  reflex microscopic     Status: Abnormal   Collection Time: 01/17/21 10:15 PM  Result Value Ref Range   Color, Urine YELLOW YELLOW   APPearance CLEAR CLEAR   Specific Gravity, Urine 1.010 1.005 - 1.030   pH 6.5 5.0 - 8.0   Glucose, UA >=500 (A) NEGATIVE mg/dL   Hgb urine dipstick NEGATIVE NEGATIVE   Bilirubin Urine NEGATIVE NEGATIVE   Ketones, ur 80 (A) NEGATIVE mg/dL   Protein, ur NEGATIVE NEGATIVE mg/dL   Nitrite NEGATIVE NEGATIVE   Leukocytes,Ua NEGATIVE NEGATIVE   Pregnancy, urine     Status: None   Collection Time: 01/17/21 10:15 PM  Result Value Ref Range   Preg Test, Ur NEGATIVE NEGATIVE  Urinalysis, Microscopic (reflex)     Status: Abnormal   Collection Time: 01/17/21 10:15 PM  Result Value Ref Range   RBC / HPF 0-5 0 - 5 RBC/hpf   WBC, UA 0-5 0 - 5 WBC/hpf   Bacteria, UA RARE (A) NONE SEEN   Squamous Epithelial / LPF 0-5 0 - 5   Budding Yeast PRESENT    Imaging Studies: CT ABDOMEN PELVIS W CONTRAST  Result Date: 01/18/2021 CLINICAL DATA:  40 year old female with epigastric pain, nausea vomiting for 4 hours. Tachycardia. EXAM: CT ABDOMEN AND PELVIS WITH CONTRAST TECHNIQUE: Multidetector CT imaging of the abdomen and pelvis was performed using the standard protocol following bolus administration of intravenous contrast. CONTRAST:  OMNIPAQUE IOHEXOL 300 MG/ML  SOLN COMPARISON:  CT Abdomen and Pelvis 09/20/2017. FINDINGS: Lower chest: Negative.  No pericardial or pleural effusions. Hepatobiliary: Chronic hepatic steatosis.  Negative gallbladder. Pancreas: Negative. Spleen: Negative. Adrenals/Urinary Tract: Normal adrenal glands. Symmetric renal enhancement with early contrast excretion. No convincing nephrolithiasis. No hydronephrosis or perinephric stranding. Negative ureters. Unremarkable urinary bladder. Punctate left hemipelvis phlebolith on series 2, image 72, normal variant. Stomach/Bowel: Negative large bowel aside from retained stool. Diminutive, normal appendix on coronal image 51. Negative terminal ileum. No dilated small bowel.  No small bowel mesenteric stranding. Stomach and duodenum appear within normal limits. No perigastric inflammation identified. No free air, free fluid. Vascular/Lymphatic: Major arterial structures are patent and normal. Portal venous system is patent. No lymphadenopathy. 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 The dominant cystic component measuring  about 3 cm diameter (3.5 cm previously) now has complex fluid density (series 2, image 66). Uterus and right ovary appear negative. Other: No pelvic free fluid. Musculoskeletal: No acute osseous abnormality identified. Lower lumbar disc and facet degeneration. IMPRESSION: 1. No acute or inflammatory process identified in the abdomen or pelvis. 2. Chronic hepatic steatosis. 3. 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 No follow-up imaging recommended. Electronically Signed   By: Odessa Fleming M.D.   On: 01/18/2021 04:16    ED COURSE and MDM  Nursing notes, initial and subsequent vitals signs, including pulse oximetry, reviewed and interpreted by myself.  Vitals:   01/18/21 0018 01/18/21 0142 01/18/21 0345 01/18/21 0430  BP: 115/78 104/67 110/68 107/65  Pulse: (!) 101 (!) 111 93 92  Resp: 18 20 20 18   Temp: 98.3 F (36.8 C) 99.1 F (37.3 C)  98.9 F (37.2 C)  TempSrc: Oral Oral  Oral  SpO2: 99% 100% 100% 100%  Weight:      Height:       Medications  sucralfate (CARAFATE) 1 GM/10ML suspension 1 g (has no administration in time range)  fentaNYL (SUBLIMAZE)  injection 50 mcg (50 mcg Intravenous Given 01/18/21 0132)  ondansetron (ZOFRAN) injection 4 mg (4 mg Intravenous Given 01/17/21 2211)  lactated ringers bolus 1,000 mL (1,000 mLs Intravenous New Bag/Given 01/18/21 0336)  ondansetron (ZOFRAN) injection 4 mg (4 mg Intravenous Given 01/18/21 0338)  fentaNYL (SUBLIMAZE) injection 50 mcg (50 mcg Intravenous Given 01/18/21 0339)  pantoprazole (PROTONIX) injection 40 mg (40 mg Intravenous Given 01/18/21 0342)  iohexol (OMNIPAQUE) 300 MG/ML solution 100 mL (100 mLs Intravenous Contrast Given 01/18/21 0354)   4:54 AM Patient CT scan and laboratory studies are unremarkable.  I suspect her pain is gastric in origin, likely gastritis or early peptic ulcer disease.  She has had some improvement with oral Carafate.  We will treat her with a  PPI and Carafate and refer to gastroenterology.   PROCEDURES  Procedures   ED DIAGNOSES     ICD-10-CM   1. Epigastric pain  R10.13        Manasseh Pittsley, Jonny Ruiz, MD 01/18/21 [phone]

## 2022-05-07 IMAGING — CT CT ABD-PELV W/ CM
2 of 4 series · 16 of 46 positions shown, 18 images · IV contrast (Omnipaque)
Comparison: CT Abdomen and Pelvis 09/20/2017.

CLINICAL DATA: 39-year-old female with epigastric pain, nausea
vomiting for 4 hours. Tachycardia.

EXAM:
CT ABDOMEN AND PELVIS WITH CONTRAST
TECHNIQUE: Multidetector CT imaging of the abdomen and pelvis was performed
using the standard protocol following bolus administration of
intravenous contrast.
CONTRAST:  100mL OMNIPAQUE IOHEXOL 300 MG/ML  SOLN

[Series 2: axial st · axial · 0.82mm/px · z∈[+742,+1137]mm · 13 of 89 slices shown, 15 images]
[im 5/89  soft-tissue]
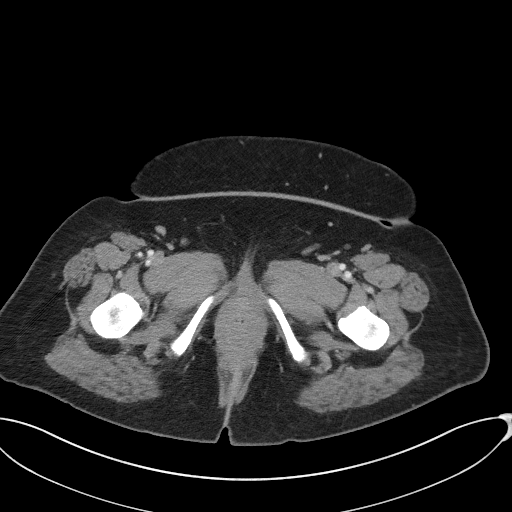
[im 5/89  bone]
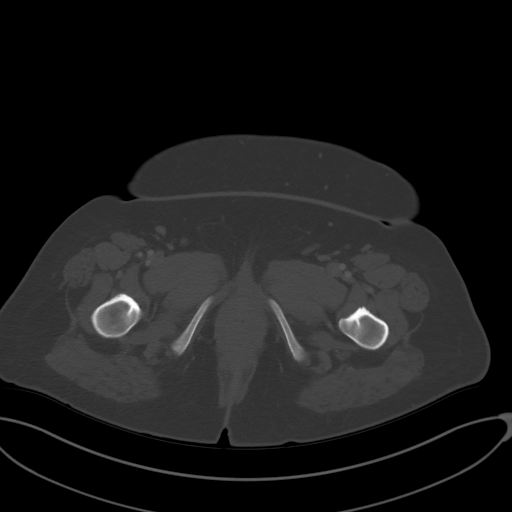
[im 13/89  soft-tissue]
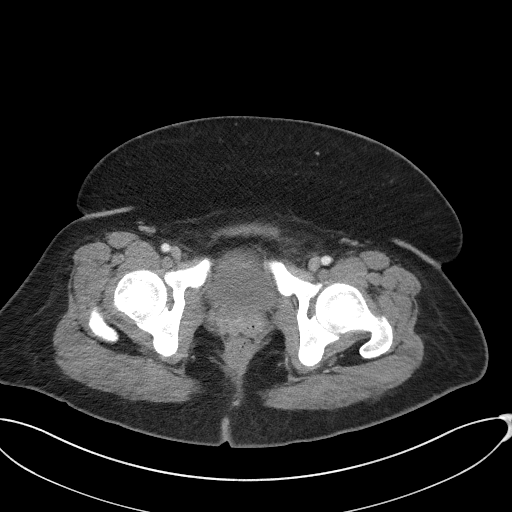
[im 17/89  soft-tissue]
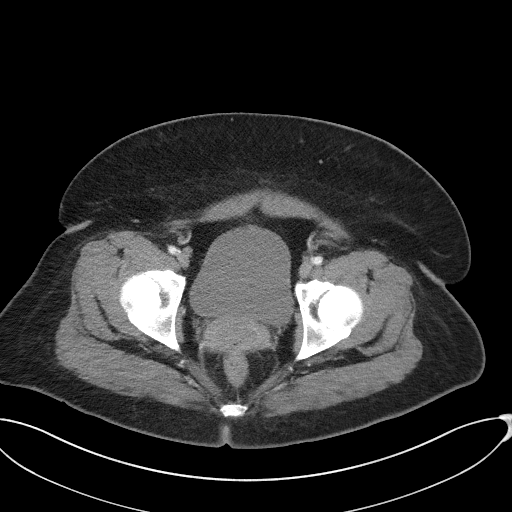
[im 26/89  soft-tissue]
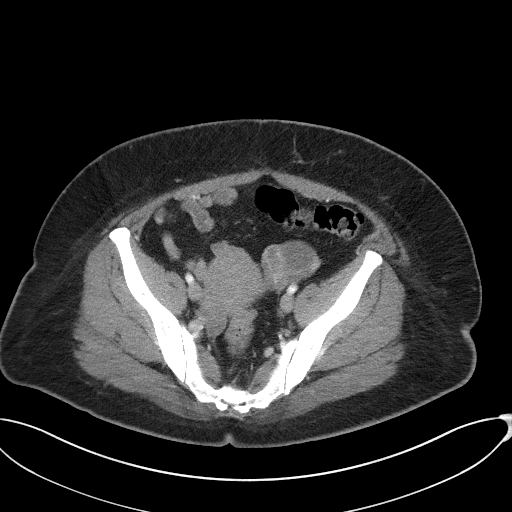
[im 30/89  soft-tissue]
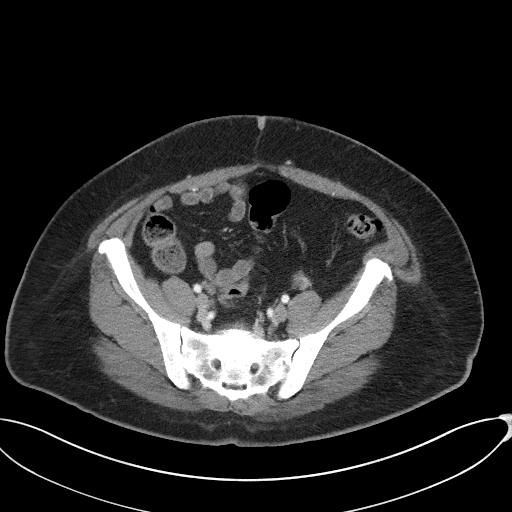
[im 38/89  soft-tissue]
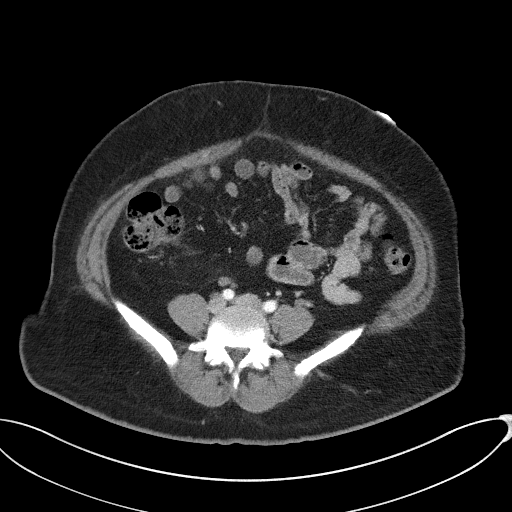
[im 47/89  soft-tissue]
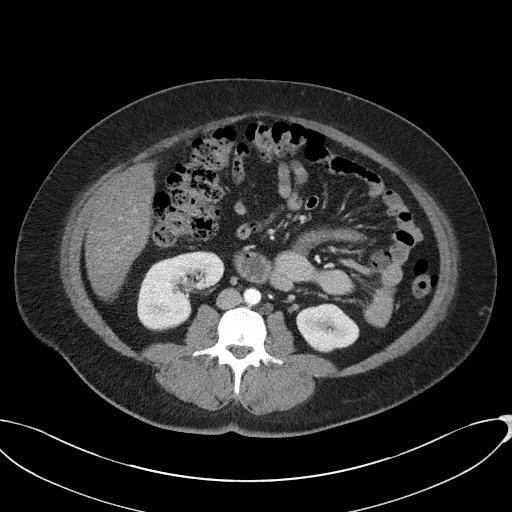
[im 51/89  soft-tissue]
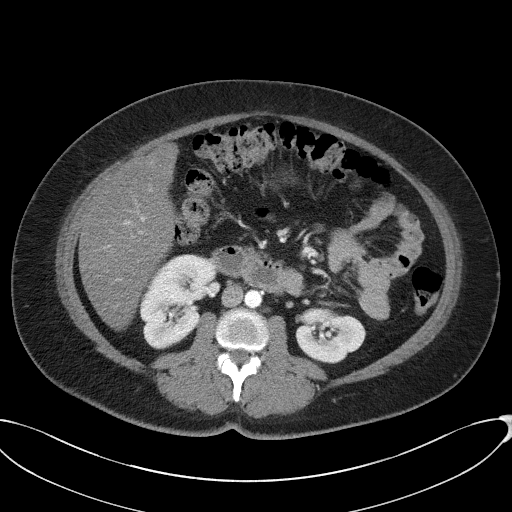
[im 59/89  soft-tissue]
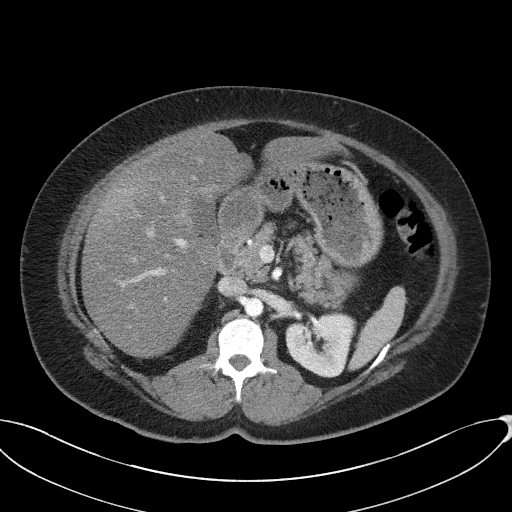
[im 59/89  bone]
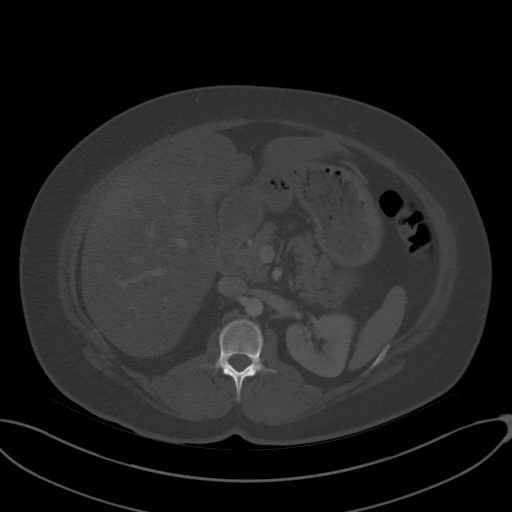
[im 63/89  soft-tissue]
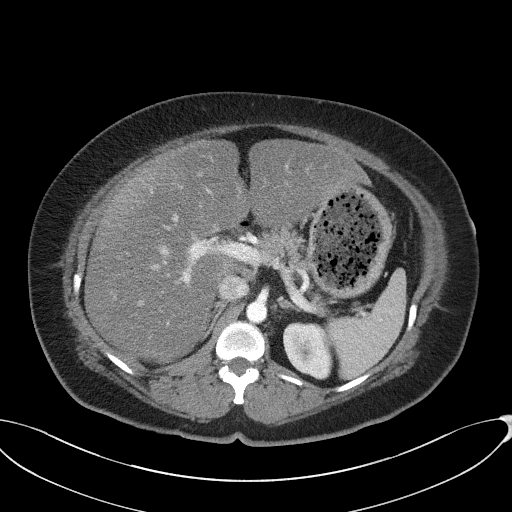
[im 72/89  soft-tissue]
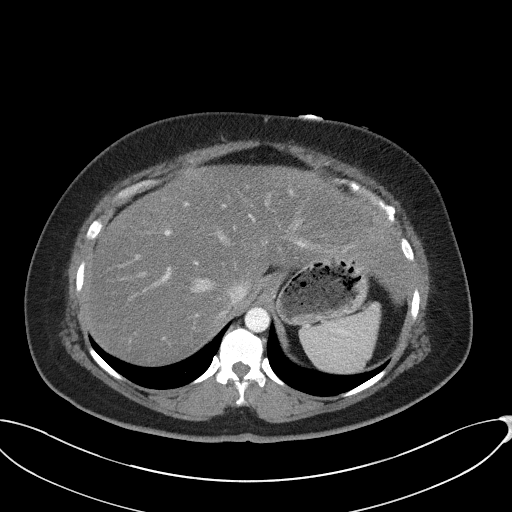
[im 76/89  soft-tissue]
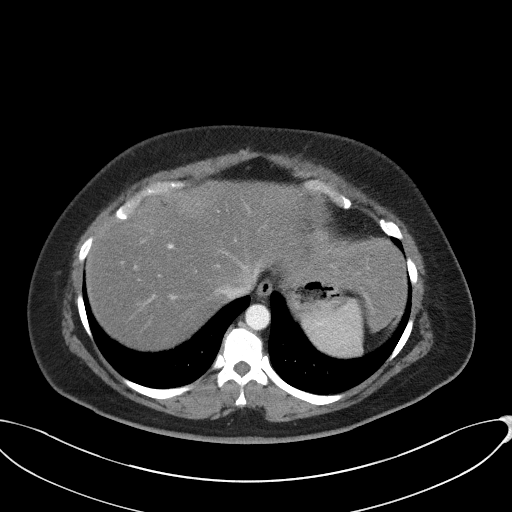
[im 84/89  soft-tissue]
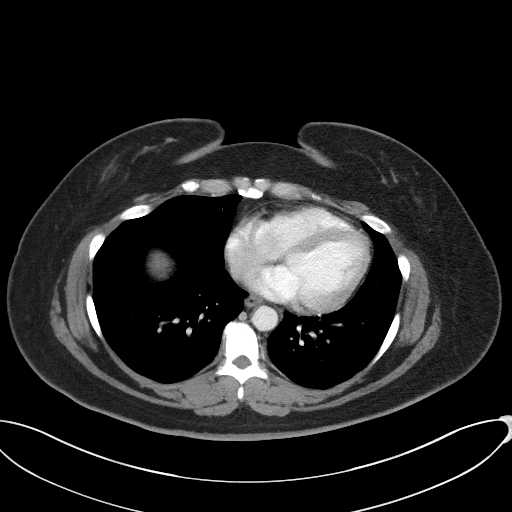

[Series 5: coronal st · coronal · 0.81mm/px · 3 of 90 slices shown]
[im 30/90  soft-tissue]
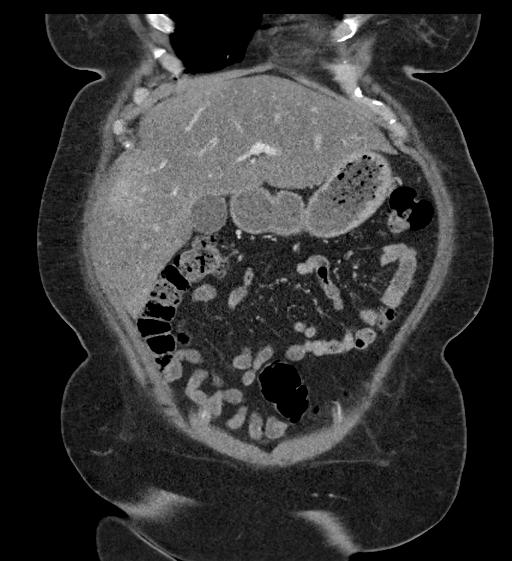
[im 40/90  soft-tissue]
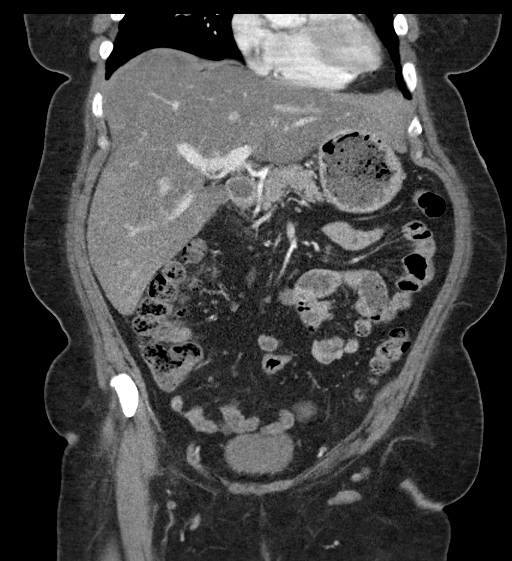
[im 50/90  soft-tissue]
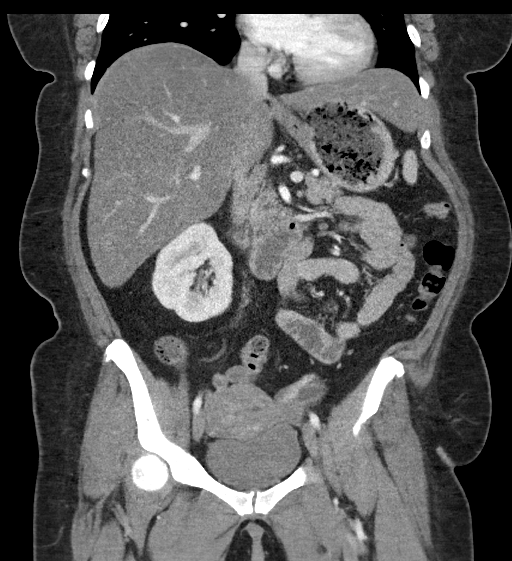

[16 of 46 positions shown; findings below may reference images not displayed]

FINDINGS: Lower chest: Negative.  No pericardial or pleural effusions.

Hepatobiliary: Chronic hepatic steatosis.  Negative gallbladder.

Pancreas: Negative.

Spleen: Negative.

Adrenals/Urinary Tract: Normal adrenal glands.

Symmetric renal enhancement with early contrast excretion. No
convincing nephrolithiasis. No hydronephrosis or perinephric
stranding. Negative ureters. Unremarkable urinary bladder. Punctate
left hemipelvis phlebolith on series 2, image 72, normal variant.

Stomach/Bowel: Negative large bowel aside from retained stool.
Diminutive, normal appendix on coronal image 51. Negative terminal
ileum.

No dilated small bowel.  No small bowel mesenteric stranding.

Stomach and duodenum appear within normal limits. No perigastric
inflammation identified. No free air, free fluid.

Vascular/Lymphatic: Major arterial structures are patent and normal.
Portal venous system is patent. No lymphadenopathy.

Reproductive: Chronic multi-cystic appearance of the left ovary has
not significantly changed since 5332. The dominant cystic component
measuring about 3 cm diameter (3.5 cm previously) now has complex
fluid density (series 2, image 66). Uterus and right ovary appear
negative.

Other: No pelvic free fluid.

Musculoskeletal: No acute osseous abnormality identified. Lower
lumbar disc and facet degeneration.
IMPRESSION: 1. No acute or inflammatory process identified in the abdomen or
pelvis.
2. Chronic hepatic steatosis.
3. Chronic multi-cystic appearance of the left ovary, decreased in
size compared to 5332 and most likely physiologic. No follow-up
imaging recommended.

## 2023-07-30 ENCOUNTER — Emergency Department (HOSPITAL_BASED_OUTPATIENT_CLINIC_OR_DEPARTMENT_OTHER): Payer: Medicaid Other

## 2023-07-30 ENCOUNTER — Encounter (HOSPITAL_BASED_OUTPATIENT_CLINIC_OR_DEPARTMENT_OTHER): Payer: Self-pay | Admitting: Emergency Medicine

## 2023-07-30 ENCOUNTER — Emergency Department (HOSPITAL_BASED_OUTPATIENT_CLINIC_OR_DEPARTMENT_OTHER)
Admission: EM | Admit: 2023-07-30 | Discharge: 2023-07-31 | Disposition: A | Discharge status: Home / Self Care | Payer: Medicaid Other | Attending: Emergency Medicine | Admitting: Emergency Medicine

## 2023-07-30 ENCOUNTER — Other Ambulatory Visit: Payer: Self-pay

## 2023-07-30 DIAGNOSIS — I10 Essential (primary) hypertension: Secondary | ICD-10-CM | POA: Diagnosis not present

## 2023-07-30 DIAGNOSIS — M65271 Calcific tendinitis, right ankle and foot: Secondary | ICD-10-CM | POA: Diagnosis not present

## 2023-07-30 DIAGNOSIS — M7751 Other enthesopathy of right foot: Secondary | ICD-10-CM

## 2023-07-30 DIAGNOSIS — E119 Type 2 diabetes mellitus without complications: Secondary | ICD-10-CM | POA: Diagnosis not present

## 2023-07-30 DIAGNOSIS — M79671 Pain in right foot: Secondary | ICD-10-CM | POA: Diagnosis present

## 2023-07-30 NOTE — ED Provider Notes (Signed)
[place] [facility] Provider Note   CSN: [id number] Arrival date & time: 07/30/23  2302     History {Add pertinent medical, surgical, social history, OB history to HPI:1} Chief Complaint  Patient presents with   Foot Pain    Shannon Hicks is a 42 y.o. female.  The history is provided by the patient.  Foot Pain  Shannon Hicks is a 42 y.o. female who presents to the Emergency Department complaining of *** Right foot pain started Monday.  No fever, chest pain, sob. No injuries.    Hx/o DM, HTN. No hx/o DVT/PE.  No hormones.       Home Medications Prior to Admission medications   Medication Sig Start Date End Date Taking? Authorizing Provider  Canagliflozin (INVOKANA PO) Take by mouth.    [provider]  omeprazole (PRILOSEC) 40 MG capsule Take 1 tablet every morning at least 30 minutes before first dose of Carafate. 01/18/21   Molpus, John, MD  sucralfate (CARAFATE) 1 g tablet Take 1 tablet (1 g total) by mouth 4 (four) times daily -  with meals and at bedtime. 01/18/21   Molpus, John, MD  famotidine (PEPCID) 20 MG tablet Take 1 tablet (20 mg total) by mouth 2 (two) times daily. 09/20/17 01/18/21  Palumbo, April, MD  metoCLOPramide (REGLAN) 10 MG tablet Take 1 tablet (10 mg total) by mouth 3 (three) times daily before meals. 09/20/17 01/18/21  Palumbo, April, MD      Allergies    Patient has no known allergies.    Review of Systems   Review of Systems  All other systems reviewed and are negative.   Physical Exam Updated Vital Signs BP 126/84 (BP Location: Left Arm)   Pulse 89   Temp 97.7 F (36.5 C)   Resp 18   Ht 5' (1.524 m)   Wt 77.1 kg   SpO2 96%   BMI 33.20 kg/m  Physical Exam Vitals and nursing note reviewed.  Constitutional:      Appearance: She is well-developed.  HENT:     Head: Normocephalic and atraumatic.  Cardiovascular:     Rate and Rhythm: Normal rate and regular rhythm.  Pulmonary:     Effort:  Pulmonary effort is normal. No respiratory distress.  Musculoskeletal:     Comments: 2+ DP pulses bilaterally.  TTP over right achilles tendon and tendon insertion on the calcaneous without appreciable swelling. No significant calf tenderness.  ROM intact at ankle.    Skin:    General: Skin is warm and dry.  Neurological:     Mental Status: She is alert and oriented to person, place, and time.  Psychiatric:        Behavior: Behavior normal.     ED Results / Procedures / Treatments   Labs (all labs ordered are listed, but only abnormal results are displayed) Labs Reviewed - No data to display  EKG None  Radiology No results found.  Procedures Procedures  {Document cardiac monitor, telemetry assessment procedure when appropriate:1}  Medications Ordered in ED Medications - No data to display  ED Course/ Medical Decision Making/ A&P   {   Click here for ABCD2, HEART and other calculatorsREFRESH Note before signing :1}                              Medical Decision Making  ***  {Document critical care time when appropriate:1} {Document review of labs  and clinical decision tools ie heart score, Chads2Vasc2 etc:1}  {Document your independent review of radiology images, and any outside records:1} {Document your discussion with family members, caretakers, and with consultants:1} {Document social determinants of health affecting pt's care:1} {Document your decision making why or why not admission, treatments were needed:1} Final Clinical Impression(s) / ED Diagnoses Final diagnoses:  None    Rx / DC Orders ED Discharge Orders     None

## 2023-07-30 NOTE — ED Triage Notes (Signed)
Right side ankle and heel pain, started Monday at work, no known injury.

## 2023-07-31 MED ORDER — IBUPROFEN 800 MG PO TABS: 800.0000 mg | ORAL_TABLET | Freq: Three times a day (TID) | ORAL | 0 refills | Status: AC | PRN

## 2023-07-31 MED ORDER — DEXAMETHASONE 4 MG PO TABS
10.0000 mg | ORAL_TABLET | Freq: Once | ORAL | Status: AC
  Administered 2023-07-31: 10 mg via ORAL
  Filled 2023-07-31: qty 3

## 2023-09-13 ENCOUNTER — Emergency Department (HOSPITAL_BASED_OUTPATIENT_CLINIC_OR_DEPARTMENT_OTHER): Payer: BLUE CROSS/BLUE SHIELD

## 2023-09-13 ENCOUNTER — Emergency Department (HOSPITAL_BASED_OUTPATIENT_CLINIC_OR_DEPARTMENT_OTHER)
Admission: EM | Admit: 2023-09-13 | Discharge: 2023-09-13 | Disposition: A | Discharge status: Home / Self Care | Payer: BLUE CROSS/BLUE SHIELD | Attending: Emergency Medicine | Admitting: Emergency Medicine

## 2023-09-13 ENCOUNTER — Other Ambulatory Visit: Payer: Self-pay

## 2023-09-13 DIAGNOSIS — E119 Type 2 diabetes mellitus without complications: Secondary | ICD-10-CM | POA: Insufficient documentation

## 2023-09-13 DIAGNOSIS — R1012 Left upper quadrant pain: Secondary | ICD-10-CM

## 2023-09-13 DIAGNOSIS — E860 Dehydration: Secondary | ICD-10-CM | POA: Insufficient documentation

## 2023-09-13 DIAGNOSIS — R112 Nausea with vomiting, unspecified: Secondary | ICD-10-CM | POA: Diagnosis present

## 2023-09-13 DIAGNOSIS — I10 Essential (primary) hypertension: Secondary | ICD-10-CM | POA: Insufficient documentation

## 2023-09-13 LAB — URINALYSIS, ROUTINE W REFLEX MICROSCOPIC
Bilirubin Urine: NEGATIVE
Glucose, UA: >=500 mg/dL — AB
Hgb urine dipstick: NEGATIVE
Ketones, ur: NEGATIVE mg/dL
Leukocytes,Ua: NEGATIVE
Nitrite: NEGATIVE
Protein, ur: NEGATIVE mg/dL
Specific Gravity, Urine: 1.01 (ref 1.005–1.030)
pH: 5 (ref 5.0–8.0)

## 2023-09-13 LAB — COMPREHENSIVE METABOLIC PANEL
ALT: 50 U/L — ABNORMAL HIGH (ref 0–44)
AST: 39 U/L (ref 15–41)
Albumin: 4.2 g/dL (ref 3.5–5.0)
Alkaline Phosphatase: 76 U/L (ref 38–126)
Anion gap: 15 (ref 5–15)
BUN: 15 mg/dL (ref 6–20)
CO2: 21 mmol/L — ABNORMAL LOW (ref 22–32)
Calcium: 8.9 mg/dL (ref 8.9–10.3)
Chloride: 99 mmol/L (ref 98–111)
Creatinine, Ser: 0.67 mg/dL (ref 0.44–1.00)
GFR, Estimated: >60 mL/min (ref >60)
Glucose, Bld: 295 mg/dL — ABNORMAL HIGH (ref 70–99)
Potassium: 4.1 mmol/L (ref 3.5–5.1)
Sodium: 135 mmol/L (ref 135–145)
Total Bilirubin: 1.2 mg/dL (ref 0.3–1.2)
Total Protein: 7.7 g/dL (ref 6.5–8.1)

## 2023-09-13 LAB — CBC
HCT: 42.9 % (ref 36.0–46.0)
Hemoglobin: 15.2 g/dL — ABNORMAL HIGH (ref 12.0–15.0)
MCH: 29.7 pg (ref 26.0–34.0)
MCHC: 35.4 g/dL (ref 30.0–36.0)
MCV: 83.8 fL (ref 80.0–100.0)
Platelets: 297 10*3/uL (ref 150–400)
RBC: 5.12 MIL/uL — ABNORMAL HIGH (ref 3.87–5.11)
RDW: 12.9 % (ref 11.5–15.5)
WBC: 10.4 10*3/uL (ref 4.0–10.5)
nRBC: 0 % (ref 0.0–0.2)

## 2023-09-13 LAB — LIPASE, BLOOD: Lipase: 31 U/L (ref 11–51)

## 2023-09-13 LAB — URINALYSIS, MICROSCOPIC (REFLEX)

## 2023-09-13 LAB — PREGNANCY, URINE: Preg Test, Ur: NEGATIVE

## 2023-09-13 MED ORDER — OMEPRAZOLE 40 MG PO CPDR: DELAYED_RELEASE_CAPSULE | ORAL | 0 refills | Status: AC

## 2023-09-13 MED ORDER — PANTOPRAZOLE SODIUM 40 MG IV SOLR
40.0000 mg | Freq: Once | INTRAVENOUS | Status: AC
  Administered 2023-09-13: 40 mg via INTRAVENOUS
  Filled 2023-09-13: qty 10

## 2023-09-13 MED ORDER — ONDANSETRON HCL 4 MG/2ML IJ SOLN
4.0000 mg | Freq: Once | INTRAMUSCULAR | Status: AC
  Administered 2023-09-13: 4 mg via INTRAVENOUS
  Filled 2023-09-13: qty 2

## 2023-09-13 MED ORDER — ALUM & MAG HYDROXIDE-SIMETH 200-200-20 MG/5ML PO SUSP
30.0000 mL | Freq: Once | ORAL | Status: AC
  Administered 2023-09-13: 30 mL via ORAL
  Filled 2023-09-13: qty 30

## 2023-09-13 MED ORDER — OXYCODONE-ACETAMINOPHEN 5-325 MG PO TABS
1.0000 | ORAL_TABLET | Freq: Three times a day (TID) | ORAL | 0 refills | Status: AC | PRN
Start: 2023-09-13 — End: ?

## 2023-09-13 MED ORDER — ONDANSETRON 4 MG PO TBDP: 4.0000 mg | ORAL_TABLET | Freq: Three times a day (TID) | ORAL | 0 refills | Status: AC | PRN

## 2023-09-13 MED ORDER — ACETAMINOPHEN 500 MG PO TABS
1000.0000 mg | ORAL_TABLET | Freq: Once | ORAL | Status: AC
  Administered 2023-09-13: 1000 mg via ORAL
  Filled 2023-09-13: qty 2

## 2023-09-13 MED ORDER — LACTATED RINGERS IV BOLUS
1000.0000 mL | Freq: Once | INTRAVENOUS | Status: AC
  Administered 2023-09-13: 1000 mL via INTRAVENOUS

## 2023-09-13 MED ORDER — LIDOCAINE VISCOUS HCL 2 % MT SOLN
15.0000 mL | Freq: Once | OROMUCOSAL | Status: AC
  Administered 2023-09-13: 15 mL via ORAL
  Filled 2023-09-13: qty 15

## 2023-09-13 MED ORDER — IOHEXOL 300 MG/ML  SOLN
100.0000 mL | Freq: Once | INTRAMUSCULAR | Status: AC | PRN
  Administered 2023-09-13: 100 mL via INTRAVENOUS

## 2023-09-13 NOTE — ED Provider Notes (Signed)
[facility] Provider Note   CSN: [id number] Arrival date & time: 09/13/23  1033     History {Add pertinent medical, surgical, social history, OB history to HPI:1} Chief Complaint  Patient presents with   Abdominal Pain    Shannon Hicks is a 42 y.o. female with PMH as listed below who presents abdominal pain, nausea, vomiting and diarrhea that began yesterday.  She has a history of this in the past and was told it was gastritis.  She does take Prilosec as well as Carafate.  She vomited 10-15 times this morning.  No hematemesis, melena, hematochezia, urinary symptoms, vaginal symptoms, flank pain, fever/chills, flulike symptoms, chest pain, shortness of breath.  She is currently taking clindamycin for a tooth infection and is planning to follow-up with a dentist.  Tachycardic in triage.  Alcohol, NSAIDs, smoking, coffee***    Past Medical History:  Diagnosis Date   Diabetes mellitus without complication (HCC)    Hypertension        Home Medications Prior to Admission medications   Medication Sig Start Date End Date Taking? Authorizing Provider  Canagliflozin (INVOKANA PO) Take by mouth.    [provider]  ibuprofen (ADVIL) 800 MG tablet Take 1 tablet (800 mg total) by mouth every 8 (eight) hours as needed. 07/31/23   Tilden Fossa, MD  omeprazole (PRILOSEC) 40 MG capsule Take 1 tablet every morning at least 30 minutes before first dose of Carafate. 01/18/21   Molpus, John, MD  sucralfate (CARAFATE) 1 g tablet Take 1 tablet (1 g total) by mouth 4 (four) times daily -  with meals and at bedtime. 01/18/21   Molpus, John, MD  famotidine (PEPCID) 20 MG tablet Take 1 tablet (20 mg total) by mouth 2 (two) times daily. 09/20/17 01/18/21  Palumbo, April, MD  metoCLOPramide (REGLAN) 10 MG tablet Take 1 tablet (10 mg total) by mouth 3 (three) times daily before meals. 09/20/17 01/18/21  Palumbo, April, MD      Allergies    Patient has no known  allergies.    Review of Systems   Review of Systems A 10 point review of systems was performed and is negative unless otherwise reported in HPI.  Physical Exam Updated Vital Signs BP 132/85 (BP Location: Left Arm)   Pulse (!) 109   Temp 98 F (36.7 C) (Oral)   Resp 18   Ht 4\' 11"  (1.499 m)   Wt 79.4 kg   SpO2 100%   BMI 35.35 kg/m  Physical Exam General: Uncomfortable appearing female, lying in bed.  HEENT: Sclera anicteric, MMM, trachea midline.  Cardiology: Regular tachycardic rate, no murmurs/rubs/gallops.  Resp: Normal respiratory rate and effort. CTAB, no wheezes, rhonchi, crackles.  Abd: Soft, mild left upper quadrant and epigastric tenderness to palpation.  Non-distended. No rebound tenderness or guarding.  GU: Deferred. MSK: No peripheral edema or signs of trauma.  Skin: warm, dry.  Back: No CVA tenderness Neuro: A&Ox4, CNs II-XII grossly intact. MAEs. Sensation grossly intact.  Psych: Normal mood and affect.   ED Results / Procedures / Treatments   Labs (all labs ordered are listed, but only abnormal results are displayed) Labs Reviewed  COMPREHENSIVE METABOLIC PANEL - Abnormal; Notable for the following components:      Result Value   CO2 21 (*)    Glucose, Bld 295 (*)    ALT 50 (*)    All other components within normal limits  CBC - Abnormal; Notable for the following  components:   RBC 5.12 (*)    Hemoglobin 15.2 (*)    All other components within normal limits  URINALYSIS, ROUTINE W REFLEX MICROSCOPIC - Abnormal; Notable for the following components:   Glucose, UA >=500 (*)    All other components within normal limits  URINALYSIS, MICROSCOPIC (REFLEX) - Abnormal; Notable for the following components:   Bacteria, UA RARE (*)    All other components within normal limits  LIPASE, BLOOD  PREGNANCY, URINE    EKG None  Radiology CT ABDOMEN PELVIS W CONTRAST  Result Date: 09/13/2023 CLINICAL DATA:  Abdominal pain, nausea, vomiting EXAM: CT ABDOMEN AND  PELVIS WITH CONTRAST TECHNIQUE: Multidetector CT imaging of the abdomen and pelvis was performed using the standard protocol following bolus administration of intravenous contrast. RADIATION DOSE REDUCTION: This exam was performed according to the departmental dose-optimization program which includes automated exposure control, adjustment of the mA and/or kV according to patient size and/or use of iterative reconstruction technique. CONTRAST:  OMNIPAQUE IOHEXOL 300 MG/ML  SOLN COMPARISON:  CT abdomen/pelvis 01/18/2021 FINDINGS: Lower chest: The lung bases are clear. The imaged heart is unremarkable. Hepatobiliary: The liver is diffusely hypoattenuating consistent with fatty infiltration. There are no focal lesions. The gallbladder is unremarkable. There is no biliary ductal dilatation. Pancreas: Unremarkable. Spleen: Unremarkable. Adrenals/Urinary Tract: The adrenals are unremarkable. The kidneys are unremarkable, with no focal lesion, stone, hydronephrosis, or hydroureter. The bladder is unremarkable. Stomach/Bowel: The stomach is unremarkable. There is no evidence of bowel obstruction. There is no abnormal bowel wall thickening or inflammatory change. The appendix is normal. Vascular/Lymphatic: The abdominal aorta is normal in course and caliber. The major branch vessels are patent. There is no abdominopelvic lymphadenopathy. Reproductive: The uterus and adnexa are unremarkable. Other: There is no ascites or free air. Musculoskeletal: There is no acute osseous abnormality or suspicious osseous lesion. IMPRESSION: 1. No acute finding in the abdomen or pelvis to explain the patient's pain. 2. Hepatic steatosis. Electronically Signed   By: Lesia Hausen M.D.   On: 09/13/2023 13:43    Procedures Procedures  {Document cardiac monitor, telemetry assessment procedure when appropriate:1}  Medications Ordered in ED Medications  pantoprazole (PROTONIX) injection 40 mg (has no administration in time range)   lactated ringers bolus 1,000 mL (has no administration in time range)  ondansetron (ZOFRAN) injection 4 mg (has no administration in time range)  iohexol (OMNIPAQUE) 300 MG/ML solution 100 mL (100 mLs Intravenous Contrast Given 09/13/23 1209)  acetaminophen (TYLENOL) tablet 1,000 mg (1,000 mg Oral Given 09/13/23 1407)  alum & mag hydroxide-simeth (MAALOX/MYLANTA) 200-200-20 MG/5ML suspension 30 mL (30 mLs Oral Given 09/13/23 1407)    And  lidocaine (XYLOCAINE) 2 % viscous mouth solution 15 mL (15 mLs Oral Given 09/13/23 1407)    ED Course/ Medical Decision Making/ A&P                          Medical Decision Making Amount and/or Complexity of Data Reviewed Labs: ordered. Decision-making details documented in ED Course. Radiology: ordered. Decision-making details documented in ED Course.  Risk OTC drugs. Prescription drug management.    This patient presents to the ED for concern of N/V/D, abdominal pain, this involves an extensive number of treatment options, and is a complaint that carries with it a high risk of complications and morbidity.  I considered the following differential and admission for this acute, potentially life threatening condition.   MDM:    For DDX for abdominal  pain includes but is not limited to:  Abdominal exam without peritoneal signs. No evidence of acute abdomen at this time. Low suspicion for acute hepatobiliary disease (including acute cholecystitis or cholangitis), acute pancreatitis (neg lipase).  Patient has a history of gastritis that felt similar to this, and I have high suspicion for gastritis based on the location of her tenderness in her abdomen.  Of course must also consider other etiologies including SBO, appendicitis, diverticulitis.  Low suspicion for gastric perforation.  Will obtain abdominal imaging to further assess.  Patient does also report that she is taking currently clindamycin which would be a risk factor for GI distress, side effects from  the medication, or C. difficile.  She is only had a few episodes of diarrhea per day and I have low suspicion for C. difficile at this time.    Clinical Course as of 09/13/23 1410  Fri Sep 13, 2023  1151 Urinalysis, Routine w reflex microscopic -Urine, Clean Catch(!) No UTI or ketonuria [HN]  1151 WBC: 10.4 No leukocytosis  [HN]  1151 Hemoglobin(!): 15.2 Likely volume contraction [HN]  1151 Preg Test, Ur: NEGATIVE Neg preg [HN]  1151 Lipase: 31 Neg lipase [HN]  1353 CT ABDOMEN PELVIS W CONTRAST 1. No acute finding in the abdomen or pelvis to explain the patient's pain. 2. Hepatic steatosis.   [HN]  1410 Patient CT without any acute findings.  This is likely her gastritis flaring.  Will treat patient with fluid for dehydration/hypovolemia, Tylenol, Protonix, Zofran, and GI cocktail and reassess. [HN]    Clinical Course User Index [HN] Loetta Rough, MD    Labs: I Ordered, and personally interpreted labs.  The pertinent results include: Those listed above  Imaging Studies ordered: I ordered imaging studies including CT abdomen pelvis I independently visualized and interpreted imaging. I agree with the radiologist interpretation  Additional history obtained from chart review.    Cardiac Monitoring: The patient was maintained on a cardiac monitor.  I personally viewed and interpreted the cardiac monitored which showed an underlying rhythm of: Sinus tachycardia  Reevaluation: After the interventions noted above, I reevaluated the patient and found that they have : Improved  Social Determinants of Health:  lives independently  Disposition:  ***  Co morbidities that complicate the patient evaluation  Past Medical History:  Diagnosis Date   Diabetes mellitus without complication (HCC)    Hypertension      Medicines Meds ordered this encounter  Medications   iohexol (OMNIPAQUE) 300 MG/ML solution 100 mL   pantoprazole (PROTONIX) injection 40 mg   acetaminophen  (TYLENOL) tablet 1,000 mg   AND Linked Order Group    alum & mag hydroxide-simeth (MAALOX/MYLANTA) 200-200-20 MG/5ML suspension 30 mL    lidocaine (XYLOCAINE) 2 % viscous mouth solution 15 mL   lactated ringers bolus 1,000 mL   ondansetron (ZOFRAN) injection 4 mg    I have reviewed the patients home medicines and have made adjustments as needed  Problem List / ED Course: Problem List Items Addressed This Visit   None        {Document critical care time when appropriate:1} {Document review of labs and clinical decision tools ie heart score, Chads2Vasc2 etc:1}  {Document your independent review of radiology images, and any outside records:1} {Document your discussion with family members, caretakers, and with consultants:1} {Document social determinants of health affecting pt's care:1} {Document your decision making why or why not admission, treatments were needed:1}  This note was created using dictation software, which may  contain spelling or grammatical errors.

## 2023-09-13 NOTE — ED Provider Notes (Signed)
  Physical Exam  BP 105/70   Pulse (!) 108   Temp 99.3 F (37.4 C) (Oral)   Resp 17   Ht 4\' 11"  (1.499 m)   Wt 79.4 kg   SpO2 98%   BMI 35.35 kg/m   Physical Exam  Procedures  Procedures  ED Course / MDM   Clinical Course as of 09/13/23 1801  Fri Sep 13, 2023  1151 Urinalysis, Routine w reflex microscopic -Urine, Clean Catch(!) No UTI or ketonuria [HN]  1151 WBC: 10.4 No leukocytosis  [HN]  1151 Hemoglobin(!): 15.2 Likely volume contraction [HN]  1151 Preg Test, Ur: NEGATIVE Neg preg [HN]  1151 Lipase: 31 Neg lipase [HN]  1353 CT ABDOMEN PELVIS W CONTRAST 1. No acute finding in the abdomen or pelvis to explain the patient's pain. 2. Hepatic steatosis.   [HN]  1410 Patient CT without any acute findings.  This is likely her gastritis flaring.  Will treat patient with fluid for dehydration/hypovolemia, Tylenol, Protonix, Zofran, and GI cocktail and reassess. [HN]  1505 Pt reevaluated. She has much improved pain but feels very dizzy with sitting up. Likely still volume down after significant nausea/vomiting and could use more fluid resuscitation. [HN]    Clinical Course User Index [HN] Loetta Rough, MD   Medical Decision Making Amount and/or Complexity of Data Reviewed Labs: ordered. Decision-making details documented in ED Course. Radiology: ordered. Decision-making details documented in ED Course.  Risk OTC drugs. Prescription drug management.   Received in signout.  Abdominal pain.  Nausea vomiting.  Fluid given.  Given oral hydration also which she tolerated.  CT scan reassuring.  Will discharge home       Benjiman Core, MD 09/13/23 [phone]

## 2023-09-13 NOTE — ED Notes (Signed)
Discharge paperwork reviewed entirely with patient, including follow up care. Pain was under control. The patient received instruction and coaching on their prescriptions, and all follow-up questions were answered.  Pt verbalized understanding as well as all parties involved. No questions or concerns voiced at the time of discharge. No acute distress noted.   Pt ambulated out to PVA without incident or assistance.

## 2023-09-13 NOTE — ED Triage Notes (Signed)
Patient presents to ED via POV from home. Here with abdominal pain, nausea, vomiting and diarrhea that began yesterday. Tachycardic in triage. Ill appearing.

## 2024-05-09 ENCOUNTER — Emergency Department (HOSPITAL_COMMUNITY)

## 2024-05-09 ENCOUNTER — Emergency Department (HOSPITAL_COMMUNITY)
Admission: EM | Admit: 2024-05-09 | Discharge: 2024-05-10 | Disposition: A | Discharge status: Home / Self Care | Attending: Emergency Medicine | Admitting: Emergency Medicine

## 2024-05-09 ENCOUNTER — Encounter (HOSPITAL_COMMUNITY): Payer: Self-pay

## 2024-05-09 DIAGNOSIS — R0789 Other chest pain: Secondary | ICD-10-CM | POA: Diagnosis present

## 2024-05-09 DIAGNOSIS — R200 Anesthesia of skin: Secondary | ICD-10-CM | POA: Insufficient documentation

## 2024-05-09 DIAGNOSIS — R1013 Epigastric pain: Secondary | ICD-10-CM | POA: Diagnosis not present

## 2024-05-09 DIAGNOSIS — I10 Essential (primary) hypertension: Secondary | ICD-10-CM | POA: Insufficient documentation

## 2024-05-09 DIAGNOSIS — E119 Type 2 diabetes mellitus without complications: Secondary | ICD-10-CM | POA: Insufficient documentation

## 2024-05-09 LAB — COMPREHENSIVE METABOLIC PANEL WITH GFR
ALT: 18 U/L (ref 0–44)
AST: 15 U/L (ref 15–41)
Albumin: 3.8 g/dL (ref 3.5–5.0)
Alkaline Phosphatase: 64 U/L (ref 38–126)
Anion gap: 16 — ABNORMAL HIGH (ref 5–15)
BUN: 10 mg/dL (ref 6–20)
CO2: 23 mmol/L (ref 22–32)
Calcium: 9.1 mg/dL (ref 8.9–10.3)
Chloride: 100 mmol/L (ref 98–111)
Creatinine, Ser: 0.6 mg/dL (ref 0.44–1.00)
GFR, Estimated: >60 mL/min (ref >60)
Glucose, Bld: 207 mg/dL — ABNORMAL HIGH (ref 70–99)
Potassium: 3 mmol/L — ABNORMAL LOW (ref 3.5–5.1)
Sodium: 139 mmol/L (ref 135–145)
Total Bilirubin: 0.9 mg/dL (ref 0.0–1.2)
Total Protein: 7 g/dL (ref 6.5–8.1)

## 2024-05-09 LAB — CBC WITH DIFFERENTIAL/PLATELET
Abs Immature Granulocytes: 0.04 10*3/uL (ref 0.00–0.07)
Basophils Absolute: 0.1 10*3/uL (ref 0.0–0.1)
Basophils Relative: 1 %
Eosinophils Absolute: 0.2 10*3/uL (ref 0.0–0.5)
Eosinophils Relative: 2 %
HCT: 39.6 % (ref 36.0–46.0)
Hemoglobin: 14.2 g/dL (ref 12.0–15.0)
Immature Granulocytes: 0 %
Lymphocytes Relative: 33 %
Lymphs Abs: 3.9 10*3/uL (ref 0.7–4.0)
MCH: 29.7 pg (ref 26.0–34.0)
MCHC: 35.9 g/dL (ref 30.0–36.0)
MCV: 82.8 fL (ref 80.0–100.0)
Monocytes Absolute: 0.6 10*3/uL (ref 0.1–1.0)
Monocytes Relative: 5 %
Neutro Abs: 6.9 10*3/uL (ref 1.7–7.7)
Neutrophils Relative %: 59 %
Platelets: 322 10*3/uL (ref 150–400)
RBC: 4.78 MIL/uL (ref 3.87–5.11)
RDW: 12.8 % (ref 11.5–15.5)
WBC: 11.7 10*3/uL — ABNORMAL HIGH (ref 4.0–10.5)
nRBC: 0 % (ref 0.0–0.2)

## 2024-05-09 LAB — HCG, SERUM, QUALITATIVE: Preg, Serum: NEGATIVE

## 2024-05-09 LAB — CBG MONITORING, ED: Glucose-Capillary: 210 mg/dL — ABNORMAL HIGH (ref 70–99)

## 2024-05-09 LAB — LIPASE, BLOOD: Lipase: 35 U/L (ref 11–51)

## 2024-05-09 LAB — TROPONIN I (HIGH SENSITIVITY)
Troponin I (High Sensitivity): 2 ng/L (ref <18)
Troponin I (High Sensitivity): 3 ng/L (ref <18)

## 2024-05-09 MED ORDER — SODIUM CHLORIDE 0.9 % IV BOLUS
1000.0000 mL | Freq: Once | INTRAVENOUS | Status: AC
  Administered 2024-05-09: 1000 mL via INTRAVENOUS

## 2024-05-09 MED ORDER — POTASSIUM CHLORIDE CRYS ER 20 MEQ PO TBCR
40.0000 meq | EXTENDED_RELEASE_TABLET | Freq: Once | ORAL | Status: AC
  Administered 2024-05-09: 40 meq via ORAL
  Filled 2024-05-09: qty 2

## 2024-05-09 NOTE — ED Triage Notes (Signed)
 BIBA from work patient reports chest pain right arm numbness started appox 5PM 8/10 tight . nitroglycerin 0.4SL admin PTA by ems with small relief. ASA 324 admin by ems  patient has h/o HTN DM CBG 223

## 2024-05-09 NOTE — ED Notes (Signed)
Patient able to tolerate fluids without difficulty.

## 2024-05-09 NOTE — ED Provider Notes (Signed)
 [facility] Provider Note   CSN: [id number] Arrival date & time: 05/09/24  2045     History {Add pertinent medical, surgical, social history, OB history to HPI:1} Chief Complaint  Patient presents with   Chest Pain    BIBA from work patient reports chest pain to right arm numbness started appox 5PM 8/10 tight nitroglycerin 0.4SL admin PTA by ems with small relief. ASA 324 admin by ems      Shannon Hicks is a 43 y.o. female otherwise healthy here presenting with chest pain and abdominal pain.  Patient states that around 5 PM she was at work when she had acute onset of epigastric pain and also nausea and chest pressure.  She states that it radiated to the right arm.  Denies any back pain or abdominal pain or vomiting.  Patient was given nitro by EMS and felt better.  Patient has no CAD or recent travel.  The history is provided by the patient.       Home Medications Prior to Admission medications   Medication Sig Start Date End Date Taking? Authorizing Provider  Canagliflozin (INVOKANA PO) Take by mouth.    [provider]  ibuprofen  (ADVIL ) 800 MG tablet Take 1 tablet (800 mg total) by mouth every 8 (eight) hours as needed. 07/31/23   Kelsey Patricia, MD  omeprazole  (PRILOSEC) 40 MG capsule Take 1 tablet every morning at least 30 minutes before first dose of Carafate . 09/13/23   Mozell Arias, MD  ondansetron  (ZOFRAN -ODT) 4 MG disintegrating tablet Take 1 tablet (4 mg total) by mouth every 8 (eight) hours as needed for nausea or vomiting. 09/13/23   Mozell Arias, MD  oxyCODONE -acetaminophen  (PERCOCET/ROXICET) 5-325 MG tablet Take 1-2 tablets by mouth every 8 (eight) hours as needed for severe pain. 09/13/23   Mozell Arias, MD  sucralfate  (CARAFATE ) 1 g tablet Take 1 tablet (1 g total) by mouth 4 (four) times daily -  with meals and at bedtime. 01/18/21   Molpus, John, MD  famotidine  (PEPCID ) 20 MG tablet Take 1 tablet (20 mg  total) by mouth 2 (two) times daily. 09/20/17 01/18/21  Palumbo, April, MD  metoCLOPramide  (REGLAN ) 10 MG tablet Take 1 tablet (10 mg total) by mouth 3 (three) times daily before meals. 09/20/17 01/18/21  Palumbo, April, MD      Allergies    Metformin and related    Review of Systems   Review of Systems  Cardiovascular:  Positive for chest pain.  All other systems reviewed and are negative.   Physical Exam Updated Vital Signs BP 123/87 (BP Location: Right Arm)   Pulse (!) 107   Temp 98.8 F (37.1 C) (Oral)   Resp 18   Ht 4\' 11"  (1.499 m)   Wt 79.8 kg   SpO2 97%   BMI 35.55 kg/m  Physical Exam Vitals and nursing note reviewed.  HENT:     Head: Normocephalic.  Eyes:     Extraocular Movements: Extraocular movements intact.     Pupils: Pupils are equal, round, and reactive to light.  Cardiovascular:     Rate and Rhythm: Normal rate and regular rhythm.     Heart sounds: Normal heart sounds.  Pulmonary:     Effort: Pulmonary effort is normal.  Abdominal:     General: Bowel sounds are normal.     Palpations: Abdomen is soft.  Musculoskeletal:        General: Normal range of motion.     Cervical  back: Normal range of motion and neck supple.  Skin:    General: Skin is warm.     Capillary Refill: Capillary refill takes less than 2 seconds.  Neurological:     General: No focal deficit present.     Mental Status: She is alert and oriented to person, place, and time.  Psychiatric:        Mood and Affect: Mood normal.        Behavior: Behavior normal.     ED Results / Procedures / Treatments   Labs (all labs ordered are listed, but only abnormal results are displayed) Labs Reviewed  CBC WITH DIFFERENTIAL/PLATELET - Abnormal; Notable for the following components:      Result Value   WBC 11.7 (*)    All other components within normal limits  COMPREHENSIVE METABOLIC PANEL WITH GFR - Abnormal; Notable for the following components:   Potassium 3.0 (*)    Glucose, Bld 207 (*)     Anion gap 16 (*)    All other components within normal limits  CBG MONITORING, ED - Abnormal; Notable for the following components:   Glucose-Capillary 210 (*)    All other components within normal limits  HCG, SERUM, QUALITATIVE  LIPASE, BLOOD  TROPONIN I (HIGH SENSITIVITY)  TROPONIN I (HIGH SENSITIVITY)    EKG EKG Interpretation Date/Time:  Saturday May 09 2024 20:52:13 EDT Ventricular Rate:  97 PR Interval:  140 QRS Duration:  88 QT Interval:  429 QTC Calculation: 545 R Axis:   13  Text Interpretation: Sinus rhythm Probable left atrial enlargement Borderline T abnormalities, anterior leads Prolonged QT interval prolonged Qt new since previous Confirmed by Florette Hurry [phone]) on 05/09/2024 9:00:58 PM  Radiology DG Chest Port 1 View Result Date: 05/09/2024 CLINICAL DATA:  Chest pain EXAM: PORTABLE CHEST 1 VIEW COMPARISON:  10/09/2016 FINDINGS: Heart and mediastinal contours are within normal limits. No focal opacities or effusions. No acute bony abnormality. IMPRESSION: No active disease. Electronically Signed   By: Janeece Mechanic M.D.   On: 05/09/2024 21:21    Procedures Procedures  {Document cardiac monitor, telemetry assessment procedure when appropriate:1}  Medications Ordered in ED Medications  sodium chloride 0.9 % bolus 1,000 mL (1,000 mLs Intravenous New Bag/Given 05/09/24 2133)  potassium chloride SA (KLOR-CON M) CR tablet 40 mEq (40 mEq Oral Given 05/09/24 2217)    ED Course/ Medical Decision Making/ A&P   {   Click here for ABCD2, HEART and other calculatorsREFRESH Note before signing :1}                              Medical Decision Making Shannon Hicks is a 43 y.o. female here presenting with   Amount and/or Complexity of Data Reviewed Labs: ordered. Radiology: ordered.  Risk Prescription drug management.    Final Clinical Impression(s) / ED Diagnoses Final diagnoses:  None    Rx / DC Orders ED Discharge Orders     None

## 2024-05-09 NOTE — ED Provider Notes (Signed)
 11:27 PM Assumed care from Dr. Delana Favors, please see their note for full history, physical and decision making until this point. In brief this is a 43 y.o. year old female who presented to the ED tonight with Chest Pain (BIBA from work patient reports chest pain to right arm numbness started appox 5PM 8/10 tight nitroglycerin 0.4SL admin PTA by ems with small relief. ASA 324 admin by ems  )     Atypical story. D/c papers ready if second trop is ok.   Second trop ok. Updated patient. Outpatient plan as per previous provider.   Discharge instructions, including strict return precautions for new or worsening symptoms, given. Patient and/or family verbalized understanding and agreement with the plan as described.   Labs, studies and imaging reviewed by myself and considered in medical decision making if ordered. Imaging interpreted by radiology.  Labs Reviewed  CBC WITH DIFFERENTIAL/PLATELET - Abnormal; Notable for the following components:      Result Value   WBC 11.7 (*)    All other components within normal limits  COMPREHENSIVE METABOLIC PANEL WITH GFR - Abnormal; Notable for the following components:   Potassium 3.0 (*)    Glucose, Bld 207 (*)    Anion gap 16 (*)    All other components within normal limits  CBG MONITORING, ED - Abnormal; Notable for the following components:   Glucose-Capillary 210 (*)    All other components within normal limits  HCG, SERUM, QUALITATIVE  LIPASE, BLOOD  TROPONIN I (HIGH SENSITIVITY)  TROPONIN I (HIGH SENSITIVITY)    DG Chest Port 1 View  Final Result      No follow-ups on file.    Caylon Saine, Reymundo Caulk, MD 05/10/24 [phone]

## 2024-05-09 NOTE — Discharge Instructions (Addendum)
 As we discussed, your labs are normal today  I have referred you to cardiology if you have persistent chest pain  Return to ER if you have worse chest pain or shortness of breath.
